# Patient Record
Sex: Male | Born: 1950 | Race: Black or African American | Hispanic: No | Marital: Single | Smoking: Current every day smoker
Health system: Southern US, Community
[De-identification: ages and names within clinical notes are randomized; demographics above are authoritative.]

## PROBLEM LIST (undated history)

## (undated) HISTORY — PX: FRACTURE SURGERY: SHX138

---

## 2020-07-02 ENCOUNTER — Observation Stay (HOSPITAL_COMMUNITY): Payer: Medicare (Managed Care)

## 2020-07-02 ENCOUNTER — Inpatient Hospital Stay (HOSPITAL_COMMUNITY)
Admission: EM | Admit: 2020-07-02 | Discharge: 2020-07-08 | DRG: 065 | Disposition: A | Discharge status: Skilled Nursing Facility | Payer: Medicare (Managed Care) | Attending: Internal Medicine | Admitting: Internal Medicine

## 2020-07-02 ENCOUNTER — Emergency Department (HOSPITAL_COMMUNITY): Payer: Medicare (Managed Care)

## 2020-07-02 ENCOUNTER — Other Ambulatory Visit: Payer: Self-pay

## 2020-07-02 ENCOUNTER — Encounter (HOSPITAL_COMMUNITY): Payer: Self-pay

## 2020-07-02 DIAGNOSIS — K5900 Constipation, unspecified: Secondary | ICD-10-CM | POA: Diagnosis present

## 2020-07-02 DIAGNOSIS — I6502 Occlusion and stenosis of left vertebral artery: Secondary | ICD-10-CM | POA: Diagnosis present

## 2020-07-02 DIAGNOSIS — R2990 Unspecified symptoms and signs involving the nervous system: Secondary | ICD-10-CM | POA: Diagnosis not present

## 2020-07-02 DIAGNOSIS — G8191 Hemiplegia, unspecified affecting right dominant side: Secondary | ICD-10-CM | POA: Diagnosis present

## 2020-07-02 DIAGNOSIS — R29706 NIHSS score 6: Secondary | ICD-10-CM | POA: Diagnosis not present

## 2020-07-02 DIAGNOSIS — I6389 Other cerebral infarction: Secondary | ICD-10-CM | POA: Diagnosis not present

## 2020-07-02 DIAGNOSIS — R29707 NIHSS score 7: Secondary | ICD-10-CM | POA: Diagnosis not present

## 2020-07-02 DIAGNOSIS — R29702 NIHSS score 2: Secondary | ICD-10-CM | POA: Diagnosis not present

## 2020-07-02 DIAGNOSIS — R29703 NIHSS score 3: Secondary | ICD-10-CM | POA: Diagnosis not present

## 2020-07-02 DIAGNOSIS — Z20822 Contact with and (suspected) exposure to covid-19: Secondary | ICD-10-CM | POA: Diagnosis present

## 2020-07-02 DIAGNOSIS — F17210 Nicotine dependence, cigarettes, uncomplicated: Secondary | ICD-10-CM | POA: Diagnosis present

## 2020-07-02 DIAGNOSIS — I10 Essential (primary) hypertension: Secondary | ICD-10-CM | POA: Diagnosis present

## 2020-07-02 DIAGNOSIS — R29705 NIHSS score 5: Secondary | ICD-10-CM | POA: Diagnosis present

## 2020-07-02 DIAGNOSIS — W19XXXA Unspecified fall, initial encounter: Secondary | ICD-10-CM

## 2020-07-02 DIAGNOSIS — E041 Nontoxic single thyroid nodule: Secondary | ICD-10-CM | POA: Diagnosis not present

## 2020-07-02 DIAGNOSIS — R4781 Slurred speech: Secondary | ICD-10-CM | POA: Diagnosis not present

## 2020-07-02 DIAGNOSIS — I444 Left anterior fascicular block: Secondary | ICD-10-CM | POA: Diagnosis present

## 2020-07-02 DIAGNOSIS — I6381 Other cerebral infarction due to occlusion or stenosis of small artery: Secondary | ICD-10-CM | POA: Diagnosis not present

## 2020-07-02 DIAGNOSIS — R29704 NIHSS score 4: Secondary | ICD-10-CM | POA: Diagnosis not present

## 2020-07-02 DIAGNOSIS — E785 Hyperlipidemia, unspecified: Secondary | ICD-10-CM | POA: Diagnosis present

## 2020-07-02 DIAGNOSIS — I639 Cerebral infarction, unspecified: Secondary | ICD-10-CM | POA: Diagnosis present

## 2020-07-02 LAB — ECHOCARDIOGRAM COMPLETE
Area-P 1/2: 3.65 cm2
Height: 72 in
S' Lateral: 3.9 cm
Weight: 3040 oz

## 2020-07-02 LAB — CBC
HCT: 45.4 % (ref 39.0–52.0)
Hemoglobin: 15.2 g/dL (ref 13.0–17.0)
MCH: 31.6 pg (ref 26.0–34.0)
MCHC: 33.5 g/dL (ref 30.0–36.0)
MCV: 94.4 fL (ref 80.0–100.0)
Platelets: 192 10*3/uL (ref 150–400)
RBC: 4.81 MIL/uL (ref 4.22–5.81)
RDW: 12.5 % (ref 11.5–15.5)
WBC: 4.4 10*3/uL (ref 4.0–10.5)
nRBC: 0 % (ref 0.0–0.2)

## 2020-07-02 LAB — I-STAT CHEM 8, ED
BUN: 17 mg/dL (ref 8–23)
Calcium, Ion: 1.21 mmol/L (ref 1.15–1.40)
Chloride: 104 mmol/L (ref 98–111)
Creatinine, Ser: 1 mg/dL (ref 0.61–1.24)
Glucose, Bld: 153 mg/dL — ABNORMAL HIGH (ref 70–99)
HCT: 43 % (ref 39.0–52.0)
Hemoglobin: 14.6 g/dL (ref 13.0–17.0)
Potassium: 4.1 mmol/L (ref 3.5–5.1)
Sodium: 139 mmol/L (ref 135–145)
TCO2: 24 mmol/L (ref 22–32)

## 2020-07-02 LAB — RAPID URINE DRUG SCREEN, HOSP PERFORMED
Amphetamines: NOT DETECTED
Barbiturates: NOT DETECTED
Benzodiazepines: NOT DETECTED
Cocaine: NOT DETECTED
Opiates: NOT DETECTED
Tetrahydrocannabinol: NOT DETECTED

## 2020-07-02 LAB — DIFFERENTIAL
Abs Immature Granulocytes: 0.01 10*3/uL (ref 0.00–0.07)
Basophils Absolute: 0 10*3/uL (ref 0.0–0.1)
Basophils Relative: 1 %
Eosinophils Absolute: 0.2 10*3/uL (ref 0.0–0.5)
Eosinophils Relative: 4 %
Immature Granulocytes: 0 %
Lymphocytes Relative: 26 %
Lymphs Abs: 1.2 10*3/uL (ref 0.7–4.0)
Monocytes Absolute: 0.7 10*3/uL (ref 0.1–1.0)
Monocytes Relative: 15 %
Neutro Abs: 2.4 10*3/uL (ref 1.7–7.7)
Neutrophils Relative %: 54 %

## 2020-07-02 LAB — URINALYSIS, ROUTINE W REFLEX MICROSCOPIC
Bacteria, UA: NONE SEEN
Bilirubin Urine: NEGATIVE
Glucose, UA: NEGATIVE mg/dL
Hgb urine dipstick: NEGATIVE
Ketones, ur: NEGATIVE mg/dL
Nitrite: NEGATIVE
Protein, ur: NEGATIVE mg/dL
Specific Gravity, Urine: 1.013 (ref 1.005–1.030)
pH: 6 (ref 5.0–8.0)

## 2020-07-02 LAB — BASIC METABOLIC PANEL
Anion gap: 9 (ref 5–15)
BUN: 16 mg/dL (ref 8–23)
CO2: 25 mmol/L (ref 22–32)
Calcium: 9.6 mg/dL (ref 8.9–10.3)
Chloride: 103 mmol/L (ref 98–111)
Creatinine, Ser: 1.02 mg/dL (ref 0.61–1.24)
GFR, Estimated: >60 mL/min (ref >60)
Glucose, Bld: 153 mg/dL — ABNORMAL HIGH (ref 70–99)
Potassium: 4.2 mmol/L (ref 3.5–5.1)
Sodium: 137 mmol/L (ref 135–145)

## 2020-07-02 LAB — PROTIME-INR
INR: 1 (ref 0.8–1.2)
Prothrombin Time: 12.8 seconds (ref 11.4–15.2)

## 2020-07-02 LAB — RESP PANEL BY RT-PCR (FLU A&B, COVID) ARPGX2
Influenza A by PCR: NEGATIVE
Influenza B by PCR: NEGATIVE
SARS Coronavirus 2 by RT PCR: NEGATIVE

## 2020-07-02 LAB — CBG MONITORING, ED
Glucose-Capillary: 150 mg/dL — ABNORMAL HIGH (ref 70–99)
Glucose-Capillary: 164 mg/dL — ABNORMAL HIGH (ref 70–99)

## 2020-07-02 LAB — APTT: aPTT: 27 seconds (ref 24–36)

## 2020-07-02 LAB — TSH: TSH: 0.419 u[IU]/mL (ref 0.350–4.500)

## 2020-07-02 MED ORDER — ATORVASTATIN CALCIUM 40 MG PO TABS
40.0000 mg | ORAL_TABLET | Freq: Every day | ORAL | Status: DC
  Administered 2020-07-02 – 2020-07-08 (×7): 40 mg via ORAL
  Filled 2020-07-02 (×7): qty 1

## 2020-07-02 MED ORDER — ENOXAPARIN SODIUM 40 MG/0.4ML ~~LOC~~ SOLN
40.0000 mg | SUBCUTANEOUS | Status: DC
  Administered 2020-07-02 – 2020-07-07 (×6): 40 mg via SUBCUTANEOUS
  Filled 2020-07-02 (×6): qty 0.4

## 2020-07-02 MED ORDER — ACETAMINOPHEN 650 MG RE SUPP: 650.0000 mg | RECTAL | Status: DC | PRN

## 2020-07-02 MED ORDER — ASPIRIN EC 81 MG PO TBEC
81.0000 mg | DELAYED_RELEASE_TABLET | Freq: Every day | ORAL | Status: DC
  Administered 2020-07-03 – 2020-07-05 (×3): 81 mg via ORAL
  Filled 2020-07-02 (×6): qty 1

## 2020-07-02 MED ORDER — SENNOSIDES-DOCUSATE SODIUM 8.6-50 MG PO TABS
1.0000 | ORAL_TABLET | Freq: Every evening | ORAL | Status: DC | PRN
  Administered 2020-07-03 – 2020-07-06 (×4): 1 via ORAL
  Filled 2020-07-02 (×4): qty 1

## 2020-07-02 MED ORDER — STROKE: EARLY STAGES OF RECOVERY BOOK
Freq: Once | Status: AC
  Filled 2020-07-02: qty 1

## 2020-07-02 MED ORDER — IOHEXOL 350 MG/ML SOLN
75.0000 mL | Freq: Once | INTRAVENOUS | Status: AC | PRN
  Administered 2020-07-02: 75 mL via INTRAVENOUS

## 2020-07-02 MED ORDER — ENSURE ENLIVE PO LIQD
237.0000 mL | Freq: Two times a day (BID) | ORAL | Status: DC
  Administered 2020-07-02 – 2020-07-07 (×9): 237 mL via ORAL

## 2020-07-02 MED ORDER — ACETAMINOPHEN 160 MG/5ML PO SOLN: 650.0000 mg | ORAL | Status: DC | PRN

## 2020-07-02 MED ORDER — ASPIRIN 325 MG PO TABS
325.0000 mg | ORAL_TABLET | Freq: Once | ORAL | Status: AC
  Administered 2020-07-02: 325 mg via ORAL
  Filled 2020-07-02: qty 1

## 2020-07-02 MED ORDER — SODIUM CHLORIDE 0.9 % IV SOLN: INTRAVENOUS | Status: DC

## 2020-07-02 MED ORDER — ACETAMINOPHEN 325 MG PO TABS
650.0000 mg | ORAL_TABLET | ORAL | Status: DC | PRN
  Filled 2020-07-02: qty 2

## 2020-07-02 MED ORDER — ADULT MULTIVITAMIN W/MINERALS CH
1.0000 | ORAL_TABLET | Freq: Every day | ORAL | Status: DC
  Administered 2020-07-03 – 2020-07-08 (×6): 1 via ORAL
  Filled 2020-07-02 (×6): qty 1

## 2020-07-02 MED ORDER — SODIUM CHLORIDE 0.9% FLUSH: 3.0000 mL | Freq: Once | INTRAVENOUS | Status: DC

## 2020-07-02 NOTE — ED Notes (Signed)
Patient states he travelled here from [place] by train and arrived today. He noticed his R leg was weak at about 6pm. Has been having intermittent R sided weakness while on the train, noticed that his speech sounded slurred to him tonight.

## 2020-07-02 NOTE — H&P (Signed)
History and Physical    Jeremy Knapp JYN:[id number] DOB: [birth year] DOA: 07/02/2020  PCP: System, Provider Not In - does not see regularly; other than trauma a year or two ago, he last saw PCP a few years ago Consultants:  None Patient coming from:  Visiting from [facility]; NOK: Niece, Jeremy Knapp, [phone]  Chief Complaint: Neurologic symptoms  HPI: Jeremy Knapp is a 70 y.o. male without known significant medical history presenting with slurred speech and R-sided weakness.  He started feeling dizzy and having trouble moving his R body.  He noticed this Thursday evening.  It wasn't that bad so he went to work Friday. He was mildly slow but could manage.  Last night, he got on the train and it got feel bad.  He was planning to go home Wednesday.  He visits here about every 3 months.  His arm is affected but mainly it is his R leg - has to drag it and forcefully lift it, pulling his body towards the R and it causes him to fall if he doesn't catch himself.  He noticed his speech is slurred and slower than usual.  No dysarthria.    ED Course:  Stroke-like symptoms, neuro consulted.  MRI pending.  Needs admission for further evaluation.  Review of Systems: As per HPI; otherwise review of systems reviewed and negative.   Ambulatory Status:  Ambulates without assistance  COVID Vaccine Status:  Complete  History reviewed. No pertinent past medical history.  Past Surgical History:  Procedure Laterality Date  . FRACTURE SURGERY      Social History   Socioeconomic History  . Marital status: Single    Spouse name: Not on file  . Number of children: Not on file  . Years of education: Not on file  . Highest education level: Not on file  Occupational History  . 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 Smoking status: Current Every Day Smoker    Packs/day: 0.25    Years: 30.00    Pack years: 7.50    Types: Cigarettes  . Smokeless tobacco: Never Used  Substance and  Sexual Activity  . Alcohol use: Never  . Drug use: Never  . Sexual activity: Not on file  Other Topics Concern  . Not on file  Social History Narrative  . Not on file   Social Determinants of Health   Financial Resource Strain: Not on file  Food Insecurity: Not on file  Transportation Needs: Not on file  Physical Activity: Not on file  Stress: Not on file  Social Connections: Not on file  Intimate Partner Violence: Not on file    No Known Allergies  Family History  Problem Relation Age of Onset  . Dementia Sister   . Stroke Neg Hx     Prior to Admission medications   Not on File    Physical Exam: Vitals:   07/02/20 0700 07/02/20 0730 07/02/20 0834 07/02/20 1122  BP: 140/86 138/80 137/77 (!) 112/99  Pulse: 82 74 71 87  Resp: Temp:   97.9 F (36.6 C) 97.9 F (36.6 C)  TempSrc:   Oral Oral  SpO2: 99% 97% 98% 96%  Weight:      Height:         . General:  Appears calm and comfortable and is in NAD . Eyes:   EOMI, normal lids, iris . ENT:  grossly normal hearing, lips & tongue, mmm; appropriate dentition . Neck:  no  LAD, masses or thyromegaly . Cardiovascular:  RRR, no m/r/g. No LE edema.  Marland Kitchen Respiratory:   CTA bilaterally with no wheezes/rales/rhonchi.  Normal respiratory effort. . Abdomen:  soft, NT, ND . Back:   normal alignment, no CVAT . Skin:  no rash or induration seen on limited exam . Musculoskeletal:  R-sided weakness 4/5 of both upper and lower extremities . Psychiatric:  grossly normal mood and affect, speech fluent and appropriate, AOx3 . Neurologic:  CN 2-12 grossly intact, moves all extremities in coordinated fashion, sensation increased on L forehead (V1) and decreased on R body    Radiological Exams on Admission: Independently reviewed - see discussion in A/P where applicable  CT ANGIO HEAD W OR WO CONTRAST  Result Date: 07/02/2020 CLINICAL DATA:  Stroke. Acute left corona radiata and caudate infarct on MRI. EXAM: CT  ANGIOGRAPHY HEAD AND NECK TECHNIQUE: Multidetector CT imaging of the head and neck was performed using the standard protocol during bolus administration of intravenous contrast. Multiplanar CT image reconstructions and MIPs were obtained to evaluate the vascular anatomy. Carotid stenosis measurements (when applicable) are obtained utilizing NASCET criteria, using the distal internal carotid diameter as the denominator. CONTRAST:  75mL OMNIPAQUE IOHEXOL 350 MG/ML SOLN COMPARISON:  None. FINDINGS: CTA NECK FINDINGS Aortic arch: Standard 3 vessel aortic arch with widely patent arch vessel origins. Right carotid system: Patent without evidence of stenosis, dissection, or significant atherosclerosis. Left carotid system: Patent without evidence of stenosis, dissection, or significant atherosclerosis. Vertebral arteries: The left vertebral artery is patent and strongly dominant without evidence of a significant stenosis or dissection. The right vertebral artery is extremely hypoplastic and is intermittently poorly visualized throughout its course in the neck with continuous patency not able to be confirmed. The left V3 segment is likely occluded. Skeleton: No acute osseous abnormality or suspicious osseous lesion. Other neck: 3.1 cm left thyroid nodule. No evidence of cervical lymphadenopathy. Upper chest: Clear lung apices. Review of the MIP images confirms the above findings CTA HEAD FINDINGS Anterior circulation: The internal carotid arteries are patent from skull base to carotid termini with atherosclerotic plaque resulting in mild right proximal supraclinoid stenosis. ACAs and MCAs are patent without evidence of a proximal branch occlusion or flow limiting proximal stenosis. There is mild left M1 narrowing. No aneurysm is identified. Posterior circulation: The intracranial left vertebral artery is patent with atherosclerotic irregularity and up to mild narrowing. The right V4 segment is not clearly identified and is  likely occluded. Patent left PICA, bilateral AICA, and bilateral SCA origins are visualized. The basilar artery is patent with mild irregularity but no flow limiting stenosis. Posterior communicating arteries are not identified and may be diminutive or absent. The PCAs are patent with atherosclerotic type irregularity bilaterally as well as mild-to-moderate left P2 stenosis. No aneurysm is identified. Venous sinuses: Patent. Anatomic variants: Strongly dominant left vertebral artery. Review of the MIP images confirms the above findings IMPRESSION: 1. 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 2. Patent left vertebral artery with mild V4 segment stenosis. 3. Mild right supraclinoid ICA and left M1 stenoses. 4. Widely patent cervical carotid arteries. 5. 3.1 cm left thyroid nodule. Recommend thyroid US (ref: J Am Coll Radiol. 2015 Feb;12(2): 143-50). Electronically Signed   By: Sebastian Ache M.D.   On: 07/02/2020 10:23   CT Head Wo Contrast  Result Date: 07/02/2020 CLINICAL DATA:  Facial trauma, weakness EXAM: CT HEAD WITHOUT CONTRAST TECHNIQUE: Contiguous axial images were obtained from the base of the skull through  the vertex without intravenous contrast. COMPARISON:  None. FINDINGS: Brain: No evidence of large-territorial acute infarction. No parenchymal hemorrhage. No mass lesion. No extra-axial collection. No mass effect or midline shift. No hydrocephalus. Basilar cisterns are patent. Vascular: No hyperdense vessel. Atherosclerotic calcifications are present within the cavernous internal carotid arteries. Skull: No acute fracture or focal lesion. Sinuses/Orbits: Paranasal sinuses and mastoid air cells are clear. The orbits are unremarkable. Other: None. IMPRESSION: No acute intracranial abnormality. Electronically Signed   By: Tish Frederickson M.D.   On: 07/02/2020 04:35   CT ANGIO NECK W OR WO CONTRAST  Result Date: 07/02/2020 CLINICAL DATA:  Stroke. Acute left corona radiata and caudate  infarct on MRI. EXAM: CT ANGIOGRAPHY HEAD AND NECK TECHNIQUE: Multidetector CT imaging of the head and neck was performed using the standard protocol during bolus administration of intravenous contrast. Multiplanar CT image reconstructions and MIPs were obtained to evaluate the vascular anatomy. Carotid stenosis measurements (when applicable) are obtained utilizing NASCET criteria, using the distal internal carotid diameter as the denominator. CONTRAST:  73mL OMNIPAQUE IOHEXOL 350 MG/ML SOLN COMPARISON:  None. FINDINGS: CTA NECK FINDINGS Aortic arch: Standard 3 vessel aortic arch with widely patent arch vessel origins. Right carotid system: Patent without evidence of stenosis, dissection, or significant atherosclerosis. Left carotid system: Patent without evidence of stenosis, dissection, or significant atherosclerosis. Vertebral arteries: The left vertebral artery is patent and strongly dominant without evidence of a significant stenosis or dissection. The right vertebral artery is extremely hypoplastic and is intermittently poorly visualized throughout its course in the neck with continuous patency not able to be confirmed. The left V3 segment is likely occluded. Skeleton: No acute osseous abnormality or suspicious osseous lesion. Other neck: 3.1 cm left thyroid nodule. No evidence of cervical lymphadenopathy. Upper chest: Clear lung apices. Review of the MIP images confirms the above findings CTA HEAD FINDINGS Anterior circulation: The internal carotid arteries are patent from skull base to carotid termini with atherosclerotic plaque resulting in mild right proximal supraclinoid stenosis. ACAs and MCAs are patent without evidence of a proximal branch occlusion or flow limiting proximal stenosis. There is mild left M1 narrowing. No aneurysm is identified. Posterior circulation: The intracranial left vertebral artery is patent with atherosclerotic irregularity and up to mild narrowing. The right V4 segment is not  clearly identified and is likely occluded. Patent left PICA, bilateral AICA, and bilateral SCA origins are visualized. The basilar artery is patent with mild irregularity but no flow limiting stenosis. Posterior communicating arteries are not identified and may be diminutive or absent. The PCAs are patent with atherosclerotic type irregularity bilaterally as well as mild-to-moderate left P2 stenosis. No aneurysm is identified. Venous sinuses: Patent. Anatomic variants: Strongly dominant left vertebral artery. Review of the MIP images confirms the above findings IMPRESSION: 1. 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 2. Patent left vertebral artery with mild V4 segment stenosis. 3. Mild right supraclinoid ICA and left M1 stenoses. 4. Widely patent cervical carotid arteries. 5. 3.1 cm left thyroid nodule. Recommend thyroid US (ref: J Am Coll Radiol. 2015 Feb;12(2): 143-50). Electronically Signed   By: Sebastian Ache M.D.   On: 07/02/2020 10:23   MR BRAIN WO CONTRAST  Result Date: 07/02/2020 CLINICAL DATA:  Sudden onset weakness since yesterday. EXAM: MRI HEAD WITHOUT CONTRAST TECHNIQUE: Multiplanar, multiecho pulse sequences of the brain and surrounding structures were obtained without intravenous contrast. COMPARISON:  Head CT from earlier today FINDINGS: Brain: Wedge of restricted diffusion at the left corona radiata and  caudate body. Minor underlying chronic white matter changes. No pre-existing gray matter infarct, acute hemorrhage, hydrocephalus, mass, or collection. Focus of chronic hemorrhage or mineralization at the right occipital pole. Vascular: Normal flow voids Skull and upper cervical spine: Normal marrow signal Sinuses/Orbits: Opacified left ethmoid sinus.  No acute finding. 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 Electronically Signed   By: Marnee SpringJonathon  Watts M.D.   On: 07/02/2020 08:21   ECHOCARDIOGRAM COMPLETE  Result Date: 07/02/2020     ECHOCARDIOGRAM REPORT   Patient Name:   Jeremy Knapp Date of Exam: 07/02/2020 Medical Rec #:  [id number]     Height:       72.0 in Accession #:    [id number]    Weight:       190.0 lb Date of Birth:  [birth year]     BSA:          2.085 m Patient Age:    69 years      BP:           137/77 mmHg Patient Gender: M             HR:           70 bpm. Exam Location:  Inpatient Procedure: 2D Echo Indications:    TIA  History:        Patient has no prior history of Echocardiogram examinations.  Sonographer:    Delcie RochLauren Pennington Referring Phys: [id number] Allie Ousley IMPRESSIONS  1. Left ventricular ejection fraction, by estimation, is 50 to 55%. The left ventricle has low normal function. The left ventricle has no regional wall motion abnormalities. Left ventricular diastolic parameters are consistent with Grade I diastolic dysfunction (impaired relaxation). Global longitudinal strain performed but not reported based on interpreter judgement due to suboptimal tracking.  2. Right ventricular systolic function is normal. The right ventricular size is normal. There is normal pulmonary artery systolic pressure. The estimated right ventricular systolic pressure is 20.0 mmHg.  3. The mitral valve is normal in structure. Trivial mitral valve regurgitation. No evidence of mitral stenosis.  4. The aortic valve is tricuspid. Aortic valve regurgitation is not visualized. Mild aortic valve sclerosis is present, with no evidence of aortic valve stenosis.  5. The inferior vena cava is normal in size with greater than 50% respiratory variability, suggesting right atrial pressure of 3 mmHg. FINDINGS  Left Ventricle: Left ventricular ejection fraction, by estimation, is 50 to 55%. The left ventricle has low normal function. The left ventricle has no regional wall motion abnormalities. Global longitudinal strain performed but not reported based on interpreter judgement due to suboptimal tracking. The left ventricular internal cavity size was  normal in size. There is no left ventricular hypertrophy. Left ventricular diastolic parameters are consistent with Grade I diastolic dysfunction (impaired relaxation). Normal left ventricular filling pressure. Right Ventricle: The right ventricular size is normal. No increase in right ventricular wall thickness. Right ventricular systolic function is normal. There is normal pulmonary artery systolic pressure. The tricuspid regurgitant velocity is 2.06 m/s, and  with an assumed right atrial pressure of 3 mmHg, the estimated right ventricular systolic pressure is 20.0 mmHg. Left Atrium: Left atrial size was normal in size. Right Atrium: Right atrial size was normal in size. Pericardium: There is no evidence of pericardial effusion. Mitral Valve: The mitral valve is normal in structure. There is mild calcification of the mitral valve leaflet(s). Trivial mitral valve regurgitation. No evidence of mitral valve stenosis. Tricuspid Valve: The  tricuspid valve is normal in structure. Tricuspid valve regurgitation is trivial. No evidence of tricuspid stenosis. Aortic Valve: The aortic valve is tricuspid. Aortic valve regurgitation is not visualized. Mild aortic valve sclerosis is present, with no evidence of aortic valve stenosis. Pulmonic Valve: The pulmonic valve was normal in structure. Pulmonic valve regurgitation is trivial. No evidence of pulmonic stenosis. Aorta: The aortic root is normal in size and structure. Venous: The inferior vena cava is normal in size with greater than 50% respiratory variability, suggesting right atrial pressure of 3 mmHg. IAS/Shunts: No atrial level shunt detected by color flow Doppler.  LEFT VENTRICLE PLAX 2D LVIDd:         5.50 cm  Diastology LVIDs:         3.90 cm  LV e' medial:    7.07 cm/s LV PW:         1.00 cm  LV E/e' medial:  10.3 LV IVS:        0.80 cm  LV e' lateral:   9.14 cm/s LVOT diam:     2.10 cm  LV E/e' lateral: 8.0 LV SV:         58 LV SV Index:   28 LVOT Area:     3.46  cm  RIGHT VENTRICLE             IVC RV S prime:     11.30 cm/s  IVC diam: 2.30 cm TAPSE (M-mode): 2.6 cm LEFT ATRIUM             Index       RIGHT ATRIUM           Index LA diam:        4.30 cm 2.06 cm/m  RA Area:     12.70 cm LA Vol (A2C):   60.0 ml 28.78 ml/m RA Volume:   29.60 ml  14.20 ml/m LA Vol (A4C):   51.8 ml 24.85 ml/m LA Biplane Vol: 56.5 ml 27.10 ml/m  AORTIC VALVE LVOT Vmax:   77.70 cm/s LVOT Vmean:  51.400 cm/s LVOT VTI:    0.168 m  AORTA Ao Root diam: 3.60 cm Ao Asc diam:  3.60 cm MITRAL VALVE                TRICUSPID VALVE MV Area (PHT): 3.65 cm     TR Peak grad:   17.0 mmHg MV Decel Time: 208 msec     TR Vmax:        206.00 cm/s MV E velocity: 72.80 cm/s MV A velocity: 106.00 cm/s  SHUNTS MV E/A ratio:  0.69         Systemic VTI:  0.17 m                             Systemic Diam: 2.10 cm Jeremy Magic MD Electronically signed by Jeremy Magic MD Signature Date/Time: 07/02/2020/11:10:29 AM    Final     EKG: Independently reviewed.  NSR with rate NSR; nonspecific ST changes with no evidence of acute ischemia   Labs on Admission: I have personally reviewed the available labs and imaging studies at the time of the admission.  Pertinent labs:   Glucose 153 Unremarkable CBC INR 1.0 COVID/flu negative   Assessment/Plan Active Problems:   Acute CVA (cerebrovascular accident) (HCC)   Thyroid nodule greater than or equal to 1.5 cm in diameter incidentally noted on imaging study   Acute CVA -Concerning for  TIA/CVA -ABCD2 score is 6 -tPA not indicated due to duration of symptoms -Aspirin has been given to reduce stroke mortality and decrease morbidity -Will place in observation status for CVA evaluation -Telemetry monitoring -MRI showed acute perforator infarct at the L corona radiata and caudate body -CTA with scattered stenosis including "diminutive" R vertebral artery with distal occlusion -Echo -If the patient does not have known afib and this is not detected on telemetry  during hospitalization, consider outpatient Holter monitoring and/or loop recorder placement. -Risk stratification with FLP, A1c; will also check TSH and UDS -Patient will need DAPT for 21 days when ABCD2 score is at least 4 and NIH score is 3 or less, and then can transition to monotherapy with a single antiplatelet agent.  Will defer to neurology for now. -Neurology consult -PT/OT/ST/Nutrition Consults -CIR consult for placement -Empirically start Lipitor 40 mg daily -Allow permissive HTN for now; treat BP only if >220/120, and then with goal of 15% reduction -He is not on any home medications and denies knowledge of any chronic medical problems  Thyroid nodule -Needs [place], can likely be performed as an outpatient - although since he is likely to be here it could also be done while hospitalized   Note: He lives in [place] and is visiting [place] and this may play a role in his D/C planning.    Note: This patient has been tested and is negative for the novel coronavirus COVID-19. He has been fully vaccinated against COVID-19.    DVT prophylaxis:  Lovenox  Code Status: Full - confirmed with patient Family Communication: None present Disposition Plan:  The patient is from: home  Anticipated d/c is to: CIR  Anticipated d/c date will depend on clinical response to treatment, likely 2-3 days  Patient is currently: acutely ill Consults called: Neurology; PT/OT/ST/Nutrition; [facility] team Admission status: It is my clinical opinion that referral for OBSERVATION is reasonable and necessary in this patient based on the above information provided. The aforementioned taken together are felt to place the patient at high risk for further clinical deterioration. However it is anticipated that the patient may be medically stable for discharge from the hospital within 24 to 48 hours.      Jonah Blue MD [facility]   How to contact the [facility] Attending or Consulting provider 7A - 7P or covering  provider during after hours 7P -7A, for this patient?  1. Check the care team in [facility] and look for a) attending/consulting [facility] provider listed and b) the [facility] team listed 2. Log into [email or web address] and use [place]'s universal password to access. If you do not have the password, please contact the hospital operator. 3. Locate the [facility] provider you are looking for under [facility] and page to a number that you can be directly reached. 4. If you still have difficulty reaching the provider, please page the [facility] (Director on Call) for the Hospitalists listed on amion for assistance.   07/02/2020, 12:52 PM

## 2020-07-02 NOTE — Consult Note (Signed)
Physical Medicine and Rehabilitation Consult Reason for Consult: Right side weakness and slurred speech Referring Physician: Dr. Jonah BlueJennifer Knapp   HPI: Jeremy MedicoMichael Knapp is a 70 y.o. right-handed male with history of tobacco use on no prescription medications.  Presented 07/02/2020 with acute onset of right-sided weakness and slurred speech as well as dizziness with fall x2.  Per chart review patient is from [place].  He was in the [place] area visiting his brother and sister.  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  CT angiogram head and neck diminutive right vertebral artery which it was occluded distally.  Patent left vertebral artery with mild V4 segment stenosis.  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  Patient did not receive tPA.  Echocardiogram with ejection fraction of 50 to 55% grade 1 diastolic dysfunction.  Admission chemistries unremarkable except glucose 153, urine drug screen negative.  Currently maintained on aspirin as well as Plavix for CVA prophylaxis.  Subcutaneous Lovenox for DVT prophylaxis.  Tolerating a regular diet.  Therapy evaluations completed due to patient's right side weakness and slurred speech recommendations of physical medicine rehab consult.  Pt states that RUE weaker than yesterday, no change in RLE strength , speech or swallowing since yesterday   Review of Systems  Constitutional: Negative for chills and fever.  HENT: Negative for hearing loss.   Eyes: Negative for blurred vision and double vision.  Respiratory: Negative for cough and shortness of breath.   Cardiovascular: Negative for chest pain.  Gastrointestinal: Positive for constipation. Negative for heartburn, nausea and vomiting.  Genitourinary: Negative for dysuria, flank pain and hematuria.  Musculoskeletal: Positive for falls.  Skin: Negative for rash.  Neurological: Positive for dizziness, speech  change and weakness.  All other systems reviewed and are negative.  History reviewed. No pertinent past medical history. Past Surgical History:  Procedure Laterality Date  . FRACTURE SURGERY     Family History  Problem Relation Age of Onset  . Dementia Sister   . Stroke Neg Hx    Social History:  reports that he has been smoking cigarettes. He has a 7.50 pack-year smoking history. He has never used smokeless tobacco. He reports that he does not drink alcohol and does not use drugs. Allergies: No Known Allergies No medications prior to admission.    Home: Home Living Family/patient expects to be discharged to:: Private residence Living Arrangements: Alone (From [place], sister and brother lives here in [place]) Available Help at Discharge: Family,Available PRN/intermittently Type of Home: Apartment Home Access: Stairs to enter Entergy CorporationEntrance Stairs-Number of Steps: 10 Entrance Stairs-Rails: Left Home Layout: One level (apartment is in the basement) Bathroom Shower/Tub: Engineer, manufacturing systemsTub/shower unit Bathroom Toilet: Standard Bathroom Accessibility: Yes Home Equipment: None Additional Comments: Pt lives and works in [place], [place], but was visiting sister via 10 hour train ride.  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 Cues for hand placement Ambulation/Gait Ambulation/Gait assistance: Min assist,Mod assist Gait Distance (  Feet): 15  Feet Assistive device: Rolling walker (2 wheeled) Gait Pattern/deviations: Step-to pattern,Decreased stride length,Decreased stance time - right,Decreased step length - left,Decreased weight shift to left,Trunk flexed,Wide base of support General Gait Details: Tends to drag R foot behind due to weakness. Cues for increasing step length. MinA for straight path but modA for turns and close quarters. Gait velocity: decreased    ADL: ADL Overall ADL's : Needs assistance/impaired Eating/Feeding: Set up,Sitting Eating/Feeding Details (indicate cue type and reason): pt given built up handle for self feeding for RUE; assist required to bring fork to mouth due to weakness and poor coordination Grooming: Minimal assistance,Standing Grooming Details (indicate cue type and reason): leaning on counter, increased time to open.close containers Upper Body Bathing: Minimal assistance,Sitting Lower Body Bathing: Moderate assistance,Sitting/lateral leans,Sit to/from stand Upper Body Dressing : Minimal assistance,Sitting,Standing Lower Body Dressing: Moderate assistance,Cueing for safety Toilet Transfer: Minimal assistance,+2 for physical assistance,+2 for safety/equipment,Ambulation,RW Toilet Transfer Details (indicate cue type and reason): pt requiring cues to avoid obstacles and to keep RLE from dragging Toileting- Clothing Manipulation and Hygiene: Moderate assistance,Sitting/lateral lean,Sit to/from stand Toileting - Clothing Manipulation Details (indicate cue type and reason): pt sat on commode, but unable to haev BM- pt wanting laxative. Functional mobility during ADLs: Minimal assistance,Rolling walker,Cueing for safety,Cueing for sequencing General ADL Comments: Pt limited by decreased strength, coordination and decreased ability to care for self with RUE weakness. Pt dragging RLE at times and cues to stay in RW for mobility in room.  Cognition: Cognition Overall Cognitive Status: Impaired/Different  from baseline Arousal/Alertness: Awake/alert Orientation Level: Oriented X4 Attention: Selective Selective Attention: Appears intact Memory: Impaired Memory Impairment: Other (comment),Retrieval deficit (recalled 3/5 words after 2 minute delay, recalled 3/4 delayed recall questions based on short story) Awareness: Appears intact Problem Solving: Appears intact Safety/Judgment: Appears intact Cognition Arousal/Alertness: Awake/alert Behavior During Therapy: Flat affect Overall Cognitive Status: Impaired/Different from baseline General Comments: Pt slightly frustrated with diagnosis wanting to know why and how he can prevent another CVA.  Blood pressure 137/73, pulse 72, temperature 98 F (36.7 C), temperature source Oral, resp. rate 18, height 6' (1.829 m), weight 86.2 kg, SpO2 98 %. Physical Exam Vitals and nursing note reviewed.  Constitutional:      Appearance: He is normal weight.  HENT:     Head: Atraumatic.  Eyes:     General:        Right eye: No discharge.        Left eye: No discharge.     Extraocular Movements: Extraocular movements intact.     Pupils: Pupils are equal, round, and reactive to light.  Cardiovascular:     Rate and Rhythm: Normal rate and regular rhythm.     Heart sounds: Normal heart sounds.  Pulmonary:     Effort: Pulmonary effort is normal. No respiratory distress.     Breath sounds: Normal breath sounds. No stridor.  Abdominal:     General: Abdomen is flat. Bowel sounds are normal. There is no distension.     Palpations: Abdomen is soft.  Musculoskeletal:     Comments: No pain with UE or LE ROM No joint swelling in limbs  Skin:    General: Skin is warm and dry.  Neurological:     Mental Status: He is alert and oriented to person, place, and time.     Cranial Nerves: No dysarthria.     Coordination: Coordination abnormal. Finger-Nose-Finger Test abnormal.     Comments: Patient is alert in no acute distress.  Mild dysarthria.  Oriented x3.   Makes good eye contact with examiner.  Follows commands.  Motor- 5/5 in LUE and LLE  3/5 Right delt, Bi, tri, grip, wrist flex and ext 4/5 Right HF, KE, ADF  Sensation itact to LT,  No facial droop Speech without aphasia  Psychiatric:        Mood and Affect: Mood normal.        Behavior: Behavior normal.     No results found for this or any previous visit (from the past 24 hour(s)). CT ANGIO HEAD W OR WO CONTRAST  Result Date: 07/02/2020 CLINICAL DATA:  Stroke. Acute left corona radiata and caudate infarct on MRI. EXAM: CT ANGIOGRAPHY HEAD AND NECK TECHNIQUE: Multidetector CT imaging of the head and neck was performed using the standard protocol during bolus administration of intravenous contrast. Multiplanar CT image reconstructions and MIPs were obtained to evaluate the vascular anatomy. Carotid stenosis measurements (when applicable) are obtained utilizing NASCET criteria, using the distal internal carotid diameter as the denominator. CONTRAST:  19mL OMNIPAQUE IOHEXOL 350 MG/ML SOLN COMPARISON:  None. FINDINGS: CTA NECK FINDINGS Aortic arch: Standard 3 vessel aortic arch with widely patent arch vessel origins. Right carotid system: Patent without evidence of stenosis, dissection, or significant atherosclerosis. Left carotid system: Patent without evidence of stenosis, dissection, or significant atherosclerosis. Vertebral arteries: The left vertebral artery is patent and strongly dominant without evidence of a significant stenosis or dissection. The right vertebral artery is extremely hypoplastic and is intermittently poorly visualized throughout its course in the neck with continuous patency not able to be confirmed. The left V3 segment is likely occluded. Skeleton: No acute osseous abnormality or suspicious osseous lesion. Other neck: 3.1 cm left thyroid nodule. No evidence of cervical lymphadenopathy. Upper chest: Clear lung apices. Review of the MIP images confirms the above findings CTA  HEAD FINDINGS Anterior circulation: The internal carotid arteries are patent from skull base to carotid termini with atherosclerotic plaque resulting in mild right proximal supraclinoid stenosis. ACAs and MCAs are patent without evidence of a proximal branch occlusion or flow limiting proximal stenosis. There is mild left M1 narrowing. No aneurysm is identified. Posterior circulation: The intracranial left vertebral artery is patent with atherosclerotic irregularity and up to mild narrowing. The right V4 segment is not clearly identified and is likely occluded. Patent left PICA, bilateral AICA, and bilateral SCA origins are visualized. The basilar artery is patent with mild irregularity but no flow limiting stenosis. Posterior communicating arteries are not identified and may be diminutive or absent. The PCAs are patent with atherosclerotic type irregularity bilaterally as well as mild-to-moderate left P2 stenosis. No aneurysm is identified. Venous sinuses: Patent. Anatomic variants: Strongly dominant left vertebral artery. Review of the MIP images confirms the above findings IMPRESSION: 1. 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 2. Patent left vertebral artery with mild V4 segment stenosis. 3. Mild right supraclinoid ICA and left M1 stenoses. 4. Widely patent cervical carotid arteries. 5. 3.1 cm left thyroid nodule. Recommend thyroid US (ref: J Am Coll Radiol. 2015 Feb;12(2): 143-50). Electronically Signed   By: Sebastian Ache M.D.   On: 07/02/2020 10:23   CT ANGIO NECK W OR WO CONTRAST  Result Date: 07/02/2020 CLINICAL DATA:  Stroke. Acute left corona radiata and caudate infarct on MRI. EXAM: CT ANGIOGRAPHY HEAD AND NECK TECHNIQUE: Multidetector CT imaging of the head and neck was performed using the standard protocol during bolus administration of intravenous contrast. Multiplanar CT image reconstructions and MIPs were  obtained to evaluate the vascular anatomy. Carotid stenosis  measurements (when applicable) are obtained utilizing NASCET criteria, using the distal internal carotid diameter as the denominator. CONTRAST:  47mL OMNIPAQUE IOHEXOL 350 MG/ML SOLN COMPARISON:  None. FINDINGS: CTA NECK FINDINGS Aortic arch: Standard 3 vessel aortic arch with widely patent arch vessel origins. Right carotid system: Patent without evidence of stenosis, dissection, or significant atherosclerosis. Left carotid system: Patent without evidence of stenosis, dissection, or significant atherosclerosis. Vertebral arteries: The left vertebral artery is patent and strongly dominant without evidence of a significant stenosis or dissection. The right vertebral artery is extremely hypoplastic and is intermittently poorly visualized throughout its course in the neck with continuous patency not able to be confirmed. The left V3 segment is likely occluded. Skeleton: No acute osseous abnormality or suspicious osseous lesion. Other neck: 3.1 cm left thyroid nodule. No evidence of cervical lymphadenopathy. Upper chest: Clear lung apices. Review of the MIP images confirms the above findings CTA HEAD FINDINGS Anterior circulation: The internal carotid arteries are patent from skull base to carotid termini with atherosclerotic plaque resulting in mild right proximal supraclinoid stenosis. ACAs and MCAs are patent without evidence of a proximal branch occlusion or flow limiting proximal stenosis. There is mild left M1 narrowing. No aneurysm is identified. Posterior circulation: The intracranial left vertebral artery is patent with atherosclerotic irregularity and up to mild narrowing. The right V4 segment is not clearly identified and is likely occluded. Patent left PICA, bilateral AICA, and bilateral SCA origins are visualized. The basilar artery is patent with mild irregularity but no flow limiting stenosis. Posterior communicating arteries are not identified and may be diminutive or absent. The PCAs are patent with  atherosclerotic type irregularity bilaterally as well as mild-to-moderate left P2 stenosis. No aneurysm is identified. Venous sinuses: Patent. Anatomic variants: Strongly dominant left vertebral artery. Review of the MIP images confirms the above findings IMPRESSION: 1. 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 2. Patent left vertebral artery with mild V4 segment stenosis. 3. Mild right supraclinoid ICA and left M1 stenoses. 4. Widely patent cervical carotid arteries. 5. 3.1 cm left thyroid nodule. Recommend thyroid US (ref: J Am Coll Radiol. 2015 Feb;12(2): 143-50). Electronically Signed   By: Sebastian Ache M.D.   On: 07/02/2020 10:23   MR BRAIN WO CONTRAST  Result Date: 07/02/2020 CLINICAL DATA:  Sudden onset weakness since yesterday. EXAM: MRI HEAD WITHOUT CONTRAST TECHNIQUE: Multiplanar, multiecho pulse sequences of the brain and surrounding structures were obtained without intravenous contrast. COMPARISON:  Head CT from earlier today FINDINGS: Brain: Wedge of restricted diffusion at the left corona radiata and caudate body. Minor underlying chronic white matter changes. No pre-existing gray matter infarct, acute hemorrhage, hydrocephalus, mass, or collection. Focus of chronic hemorrhage or mineralization at the right occipital pole. Vascular: Normal flow voids Skull and upper cervical spine: Normal marrow signal Sinuses/Orbits: Opacified left ethmoid sinus.  No acute finding. 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 Electronically Signed   By: Marnee Spring M.D.   On: 07/02/2020 08:21   ECHOCARDIOGRAM COMPLETE  Result Date: 07/02/2020    ECHOCARDIOGRAM REPORT   Patient Name:   Jeremy Knapp Date of Exam: 07/02/2020 Medical Rec #:  [id number]     Height:       72.0 in Accession #:    [id number]    Weight:       190.0 lb Date of Birth:  [birth year]     BSA:  2.085 m Patient Age:    69 years      BP:           137/77 mmHg Patient Gender: M              HR:           70 bpm. Exam Location:  Inpatient Procedure: 2D Echo Indications:    TIA  History:        Patient has no prior history of Echocardiogram examinations.  Sonographer:    Delcie Roch Referring Phys: [id number] Jeremy Knapp IMPRESSIONS  1. Left ventricular ejection fraction, by estimation, is 50 to 55%. The left ventricle has low normal function. The left ventricle has no regional wall motion abnormalities. Left ventricular diastolic parameters are consistent with Grade I diastolic dysfunction (impaired relaxation). Global longitudinal strain performed but not reported based on interpreter judgement due to suboptimal tracking.  2. Right ventricular systolic function is normal. The right ventricular size is normal. There is normal pulmonary artery systolic pressure. The estimated right ventricular systolic pressure is 20.0 mmHg.  3. The mitral valve is normal in structure. Trivial mitral valve regurgitation. No evidence of mitral stenosis.  4. The aortic valve is tricuspid. Aortic valve regurgitation is not visualized. Mild aortic valve sclerosis is present, with no evidence of aortic valve stenosis.  5. The inferior vena cava is normal in size with greater than 50% respiratory variability, suggesting right atrial pressure of 3 mmHg. FINDINGS  Left Ventricle: Left ventricular ejection fraction, by estimation, is 50 to 55%. The left ventricle has low normal function. The left ventricle has no regional wall motion abnormalities. Global longitudinal strain performed but not reported based on interpreter judgement due to suboptimal tracking. The left ventricular internal cavity size was normal in size. There is no left ventricular hypertrophy. Left ventricular diastolic parameters are consistent with Grade I diastolic dysfunction (impaired relaxation). Normal left ventricular filling pressure. Right Ventricle: The right ventricular size is normal. No increase in right ventricular wall thickness.  Right ventricular systolic function is normal. There is normal pulmonary artery systolic pressure. The tricuspid regurgitant velocity is 2.06 m/s, and  with an assumed right atrial pressure of 3 mmHg, the estimated right ventricular systolic pressure is 20.0 mmHg. Left Atrium: Left atrial size was normal in size. Right Atrium: Right atrial size was normal in size. Pericardium: There is no evidence of pericardial effusion. Mitral Valve: The mitral valve is normal in structure. There is mild calcification of the mitral valve leaflet(s). Trivial mitral valve regurgitation. No evidence of mitral valve stenosis. Tricuspid Valve: The tricuspid valve is normal in structure. Tricuspid valve regurgitation is trivial. No evidence of tricuspid stenosis. Aortic Valve: The aortic valve is tricuspid. Aortic valve regurgitation is not visualized. Mild aortic valve sclerosis is present, with no evidence of aortic valve stenosis. Pulmonic Valve: The pulmonic valve was normal in structure. Pulmonic valve regurgitation is trivial. No evidence of pulmonic stenosis. Aorta: The aortic root is normal in size and structure. Venous: The inferior vena cava is normal in size with greater than 50% respiratory variability, suggesting right atrial pressure of 3 mmHg. IAS/Shunts: No atrial level shunt detected by color flow Doppler.  LEFT VENTRICLE PLAX 2D LVIDd:         5.50 cm  Diastology LVIDs:         3.90 cm  LV e' medial:    7.07 cm/s LV PW:         1.00 cm  LV E/e' medial:  10.3 LV IVS:        0.80 cm  LV e' lateral:   9.14 cm/s LVOT diam:     2.10 cm  LV E/e' lateral: 8.0 LV SV:         58 LV SV Index:   28 LVOT Area:     3.46 cm  RIGHT VENTRICLE             IVC RV S prime:     11.30 cm/s  IVC diam: 2.30 cm TAPSE (M-mode): 2.6 cm LEFT ATRIUM             Index       RIGHT ATRIUM           Index LA diam:        4.30 cm 2.06 cm/m  RA Area:     12.70 cm LA Vol (A2C):   60.0 ml 28.78 ml/m RA Volume:   29.60 ml  14.20 ml/m LA Vol (A4C):    51.8 ml 24.85 ml/m LA Biplane Vol: 56.5 ml 27.10 ml/m  AORTIC VALVE LVOT Vmax:   77.70 cm/s LVOT Vmean:  51.400 cm/s LVOT VTI:    0.168 m  AORTA Ao Root diam: 3.60 cm Ao Asc diam:  3.60 cm MITRAL VALVE                TRICUSPID VALVE MV Area (PHT): 3.65 cm     TR Peak grad:   17.0 mmHg MV Decel Time: 208 msec     TR Vmax:        206.00 cm/s MV E velocity: 72.80 cm/s MV A velocity: 106.00 cm/s  SHUNTS MV E/A ratio:  0.69         Systemic VTI:  0.17 m                             Systemic Diam: 2.10 cm Armanda Magic MD Electronically signed by Armanda Magic MD Signature Date/Time: 07/02/2020/11:10:29 AM    Final      Assessment/Plan: 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 Does the need for close, 24 hr/day medical supervision in concert with the patient's rehab needs make it unreasonable for this patient to be served in a less intensive setting? Yes 2. Co-Morbidities requiring supervision/potential complications: tobacco use 3. Due to bladder management, bowel management, safety, skin/wound care, disease management, medication administration, pain management and patient education, does the patient require 24 hr/day rehab nursing? Yes 4. Does the patient require coordinated care of a physician, rehab nurse, therapy disciplines of PT< OT to address physical and functional deficits in the context of the above medical diagnosis(es)? Yes Addressing deficits in the following areas: balance, endurance, locomotion, strength, transferring, bowel/bladder control, bathing, dressing, toileting and psychosocial support 5. Can the patient actively participate in an intensive therapy program of at least 3 hrs of therapy per day at least 5 days per week? Yes 6. The potential for patient to make measurable gains while on inpatient rehab is excellent 7. Anticipated functional outcomes upon discharge from inpatient rehab are modified independent  with PT, modified independent with OT, n/a with  SLP. 8. Estimated rehab length of stay to reach the above functional goals is: 7-10d 9. Anticipated discharge destination: Home 10. Overall Rehab/Functional Prognosis: excellent  RECOMMENDATIONS: This patient's condition is appropriate for continued rehabilitative care in the following setting: CIR Patient has agreed to participate in recommended program. Yes Note that insurance  prior authorization may be required for reimbursement for recommended care.  Comment: Hold off on CIR until RIght upper ext weakness is re evaluated.  Had 5/5 proximal RUE strength yesterday with 4-/5 grip per neuro , today is 3/5 , neuro has signed off   Charlton Amor, PA-C 07/04/2020   "I have personally performed a face to face diagnostic evaluation of this patient.  Additionally, I have reviewed and concur with the physician assistant's documentation above." Erick Colace M.D. [facility] Fellow Am Acad of Phys Med and Rehab Diplomate Am Board of Electrodiagnostic Med Fellow Am Board of Interventional Pain

## 2020-07-02 NOTE — ED Notes (Signed)
Patient transported to MRI 

## 2020-07-02 NOTE — Progress Notes (Signed)
Sleeping but easily arousable.  Reports right side is improved.  He has mild drift of the right upper extremity and right legs but normal strength on direct testing.  MRI reviewed in person.  Work-up pending.

## 2020-07-02 NOTE — Progress Notes (Signed)
Inpatient Rehab Admissions Coordinator:  Consult received. Note pt is under observation status at this time. Pt may not have the medical necessity to warrant an inpatient rehab stay if they remain observation. If status were to change to inpatient, AC will assess for candidacy.   Mayetta Castleman Graves Madden, MS, CCC-SLP Admissions Coordinator [phone]  

## 2020-07-02 NOTE — ED Notes (Signed)
Neuro at  The bedside 

## 2020-07-02 NOTE — Consult Note (Signed)
NEURO HOSPITALIST CONSULT NOTE   Requesting physician: Dr. Nicanor Alcon  Reason for Consult: New onset of slurred speech and right sided weakness  History obtained from:  Patient and Chart     HPI:                                                                                                                                          Jeremy Knapp is an 70 y.o. male who presents to the ED with RLE weakness, first noticed at about 6 PM on Friday when he started dragging his right leg while walking. He was traveling by train from [place] and had had intermittent right sided weakness while on the train. He has had 2 falls since the weakness was noticed. He also endorses some right arm weakness and dysarthria. He denies facial weakness, difficulty understanding speech or confusion. He does not endorse any vision changes.  He has no prior history of stroke.   History reviewed. No pertinent past medical history.  Past Surgical History:  Procedure Laterality Date  . FRACTURE SURGERY      History reviewed. No pertinent family history.            Social History:  reports that he has been smoking. He has never used smokeless tobacco. He reports that he does not drink alcohol and does not use drugs.  No Known Allergies  MEDICATIONS:                                                                                                                     No medications listed in Epic.    ROS:  As per HPI. Does not endorse additional symptoms.    Blood pressure 139/88, pulse 82, temperature 98.1 F (36.7 C), temperature source Oral, resp. rate 18, height 6' (1.829 m), weight 86.2 kg, SpO2 98 %.   General Examination:                                                                                                       Physical Exam  HEENT-  [place]/AT    Lungs-  Respirations unlabored Extremities- No edema  Neurological Examination Mental Status: Awake and alert. Speech fluent with intact naming and comprehension. Subtle dysarthria. Oriented x 5.  Cranial Nerves: II: Temporal visual fields intact with no extinction to DSS. PERRL. III,IV, VI: EOMI. No ptosis or nystagmus.   V,VII: Subtle right facial weakness during speech and when making facial expressions. Temp sensation equal bilaterally.  VIII: Hearing intact to voice IX,X: No hypophonia XI: Head is midline XII: Midline tongue extension Motor: RUE 4-/5 RLE 4/5 LUE and LLE 5/5 Sensory: Decreased temp sensation to LUE. Decreased temp sensation to RLE. FT intact x 4. No extinction to DSS.  Deep Tendon Reflexes:  Slightly hypoactive reflexes on the right Plantars: Right: downgoing   Left: downgoing Cerebellar: Subtle dystaxia with FNF on the right.  Gait: Deferred   Lab Results: Basic Metabolic Panel: Recent Labs  Lab 07/02/20 0315 07/02/20 0324  NA 137 139  K 4.2 4.1  CL 103 104  CO2 25  --   GLUCOSE 153* 153*  BUN 16 17  CREATININE 1.02 1.00  CALCIUM 9.6  --     CBC: Recent Labs  Lab 07/02/20 0315 07/02/20 0324  WBC 4.4  --   NEUTROABS 2.4  --   HGB 15.2 14.6  HCT 45.4 43.0  MCV 94.4  --   PLT 192  --     Cardiac Enzymes: No results for input(s): CKTOTAL, CKMB, CKMBINDEX, TROPONINI in the last 168 hours.  Lipid Panel: No results for input(s): CHOL, TRIG, HDL, CHOLHDL, VLDL, LDLCALC in the last 168 hours.  Imaging: CT Head Wo Contrast  Result Date: 07/02/2020 CLINICAL DATA:  Facial trauma, weakness EXAM: CT HEAD WITHOUT CONTRAST TECHNIQUE: Contiguous axial images were obtained from the base of the skull through the vertex without intravenous contrast. COMPARISON:  None. FINDINGS: Brain: No evidence of large-territorial acute infarction. No parenchymal hemorrhage. No mass lesion. No extra-axial collection. No mass effect or midline shift. No hydrocephalus. Basilar  cisterns are patent. Vascular: No hyperdense vessel. Atherosclerotic calcifications are present within the cavernous internal carotid arteries. Skull: No acute fracture or focal lesion. Sinuses/Orbits: Paranasal sinuses and mastoid air cells are clear. The orbits are unremarkable. Other: None. IMPRESSION: No acute intracranial abnormality. Electronically Signed   By: Tish Frederickson M.D.   On: 07/02/2020 04:35    Assessment: 70 year old male with new onset of right sided weakness and slurred speech 1. Exam reveals findings referable to a left hemisphere lesion, most likely an acute stroke.  2. CT head: No acute abnormality.   Recommendations: 1. HgbA1c, fasting lipid panel 2. MRI  of the brain without contrast 3. PT consult, OT consult, Speech consult 4. Echocardiogram 5. ASA 325 mg po x 1 now, then 81 mg po qd 6. CTA of head and neck  7. Risk factor modification 8. Telemetry monitoring 9. Frequent neuro checks 10 NPO until passes stroke swallow screen 11. Modified permissive HTN given patient's age. Treat if SBP > 200 12. Decision on statin pending MRI    Electronically signed: Dr. Caryl Pina 07/02/2020, 5:52 AM

## 2020-07-02 NOTE — ED Provider Notes (Addendum)
[facility] EMERGENCY DEPARTMENT Provider Note   CSN: [id number] Arrival date & time: 07/02/20  0300     History Chief Complaint  Patient presents with  . Weakness    Jeremy Knapp is a 70 y.o. male.  The history is provided by the patient.  Weakness Severity:  Severe Onset quality:  Sudden Duration: since yesterday  Timing:  Constant Progression:  Unchanged Chronicity:  New Context: not allergies and not change in medication   Relieved by:  Nothing Worsened by:  Nothing Ineffective treatments:  None tried Associated symptoms: difficulty walking and stroke symptoms   Associated symptoms: no abdominal pain, no arthralgias, no chest pain, no cough, no diarrhea, no dysuria, no fever, no headaches, no loss of consciousness, no shortness of breath, no syncope, no vision change and no vomiting   Associated symptoms comment:  Dysarthria and falls x 2 secondary to weakness Risk factors: no anemia   Patient with no PMH presents with dysarthria      History reviewed. No pertinent past medical history.  There are no problems to display for this patient.   Past Surgical History:  Procedure Laterality Date  . FRACTURE SURGERY         History reviewed. No pertinent family history.  Social History   Tobacco Use  . Smoking status: Current Some Day Smoker  . Smokeless tobacco: Never Used  Substance Use Topics  . Alcohol use: Never  . Drug use: Never    Home Medications Prior to Admission medications   Not on File    Allergies    Patient has no known allergies.  Review of Systems   Review of Systems  Constitutional: Negative for fever.  HENT: Negative for congestion.   Eyes: Negative for visual disturbance.  Respiratory: Negative for cough and shortness of breath.   Cardiovascular: Negative for chest pain and syncope.  Gastrointestinal: Negative for abdominal pain, diarrhea and vomiting.  Genitourinary: Negative for dysuria.   Musculoskeletal: Negative for arthralgias.  Skin: Negative for rash.  Neurological: Positive for speech difficulty and weakness. Negative for loss of consciousness and headaches.  Psychiatric/Behavioral: Negative for agitation.  All other systems reviewed and are negative.   Physical Exam Updated Vital Signs BP 139/88   Pulse 82   Temp 98.1 F (36.7 C) (Oral)   Resp 18   Ht 6' (1.829 m)   Wt 86.2 kg   SpO2 98%   BMI 25.77 kg/m   Physical Exam Vitals and nursing note reviewed.  Constitutional:      General: He is not in acute distress.    Appearance: Normal appearance.  HENT:     Head: Normocephalic and atraumatic.     Nose: Nose normal.     Mouth/Throat:     Mouth: Mucous membranes are moist.     Pharynx: Oropharynx is clear.  Eyes:     Extraocular Movements: Extraocular movements intact.     Conjunctiva/sclera: Conjunctivae normal.     Pupils: Pupils are equal, round, and reactive to light.  Cardiovascular:     Rate and Rhythm: Normal rate and regular rhythm.     Pulses: Normal pulses.     Heart sounds: Normal heart sounds.  Pulmonary:     Effort: Pulmonary effort is normal.     Breath sounds: Normal breath sounds.  Abdominal:     General: Abdomen is flat. Bowel sounds are normal.     Palpations: Abdomen is soft.     Tenderness: There is no abdominal  tenderness. There is no guarding.  Musculoskeletal:        General: Normal range of motion.     Cervical back: Normal range of motion and neck supple.     Comments: 4+ RLE , 5-/5 RUE  Skin:    General: Skin is warm and dry.     Capillary Refill: Capillary refill takes less than 2 seconds.  Neurological:     General: No focal deficit present.     Mental Status: He is alert.     Deep Tendon Reflexes: Reflexes normal.  Psychiatric:        Mood and Affect: Mood normal.        Behavior: Behavior normal.     ED Results / Procedures / Treatments   Labs (all labs ordered are listed, but only abnormal results  are displayed) Results for orders placed or performed during the hospital encounter of 07/02/20  Basic metabolic panel  Result Value Ref Range   Sodium 137 135 - 145 mmol/L   Potassium 4.2 3.5 - 5.1 mmol/L   Chloride 103 98 - 111 mmol/L   CO2 25 22 - 32 mmol/L   Glucose, Bld 153 (H) 70 - 99 mg/dL   BUN 16 8 - 23 mg/dL   Creatinine, Ser 1.74 0.61 - 1.24 mg/dL   Calcium 9.6 8.9 - 94.4 mg/dL   GFR, Estimated >96 >75 mL/min   Anion gap 9 5 - 15  CBC  Result Value Ref Range   WBC 4.4 4.0 - 10.5 K/uL   RBC 4.81 4.22 - 5.81 MIL/uL   Hemoglobin 15.2 13.0 - 17.0 g/dL   HCT 91.6 38.4 - 66.5 %   MCV 94.4 80.0 - 100.0 fL   MCH 31.6 26.0 - 34.0 pg   MCHC 33.5 30.0 - 36.0 g/dL   RDW 99.3 57.0 - 17.7 %   Platelets 192 150 - 400 K/uL   nRBC 0.0 0.0 - 0.2 %  Urinalysis, Routine w reflex microscopic Urine, Clean Catch  Result Value Ref Range   Color, Urine YELLOW YELLOW   APPearance CLEAR CLEAR   Specific Gravity, Urine 1.013 1.005 - 1.030   pH 6.0 5.0 - 8.0   Glucose, UA NEGATIVE NEGATIVE mg/dL   Hgb urine dipstick NEGATIVE NEGATIVE   Bilirubin Urine NEGATIVE NEGATIVE   Ketones, ur NEGATIVE NEGATIVE mg/dL   Protein, ur NEGATIVE NEGATIVE mg/dL   Nitrite NEGATIVE NEGATIVE   Leukocytes,Ua TRACE (A) NEGATIVE   RBC / HPF 0-5 0 - 5 RBC/hpf   WBC, UA 0-5 0 - 5 WBC/hpf   Bacteria, UA NONE SEEN NONE SEEN   Squamous Epithelial / LPF 0-5 0 - 5   Mucus PRESENT   Protime-INR  Result Value Ref Range   Prothrombin Time 12.8 11.4 - 15.2 seconds   INR 1.0 0.8 - 1.2  APTT  Result Value Ref Range   aPTT 27 24 - 36 seconds  Differential  Result Value Ref Range   Neutrophils Relative % 54 %   Neutro Abs 2.4 1.7 - 7.7 K/uL   Lymphocytes Relative 26 %   Lymphs Abs 1.2 0.7 - 4.0 K/uL   Monocytes Relative 15 %   Monocytes Absolute 0.7 0.1 - 1.0 K/uL   Eosinophils Relative 4 %   Eosinophils Absolute 0.2 0.0 - 0.5 K/uL   Basophils Relative 1 %   Basophils Absolute 0.0 0.0 - 0.1 K/uL   Immature  Granulocytes 0 %   Abs Immature Granulocytes 0.01 0.00 - 0.07 K/uL  CBG monitoring, ED  Result Value Ref Range   Glucose-Capillary 150 (H) 70 - 99 mg/dL  CBG monitoring, ED  Result Value Ref Range   Glucose-Capillary 164 (H) 70 - 99 mg/dL  I-stat chem 8, ED  Result Value Ref Range   Sodium 139 135 - 145 mmol/L   Potassium 4.1 3.5 - 5.1 mmol/L   Chloride 104 98 - 111 mmol/L   BUN 17 8 - 23 mg/dL   Creatinine, Ser 6.94 0.61 - 1.24 mg/dL   Glucose, Bld 854 (H) 70 - 99 mg/dL   Calcium, Ion 6.27 0.35 - 1.40 mmol/L   TCO2 24 22 - 32 mmol/L   Hemoglobin 14.6 13.0 - 17.0 g/dL   HCT 00.9 38.1 - 82.9 %   CT Head Wo Contrast  Result Date: 07/02/2020 CLINICAL DATA:  Facial trauma, weakness EXAM: CT HEAD WITHOUT CONTRAST TECHNIQUE: Contiguous axial images were obtained from the base of the skull through the vertex without intravenous contrast. COMPARISON:  None. FINDINGS: Brain: No evidence of large-territorial acute infarction. No parenchymal hemorrhage. No mass lesion. No extra-axial collection. No mass effect or midline shift. No hydrocephalus. Basilar cisterns are patent. Vascular: No hyperdense vessel. Atherosclerotic calcifications are present within the cavernous internal carotid arteries. Skull: No acute fracture or focal lesion. Sinuses/Orbits: Paranasal sinuses and mastoid air cells are clear. The orbits are unremarkable. Other: None. IMPRESSION: No acute intracranial abnormality. Electronically Signed   By: Tish Frederickson M.D.   On: 07/02/2020 04:35    EKG EKG Interpretation  Date/Time:  Saturday Baltazar [place] 23 2022 03:11:58 EDT Ventricular Rate:  85 PR Interval:  176 QRS Duration: 110 QT Interval:  382 QTC Calculation: 454 R Axis:   -32 Text Interpretation: Normal sinus rhythm Left axis deviation Confirmed by Nicanor Alcon, Lauretta Sallas ([id number]) on 07/02/2020 5:11:07 AM   Radiology CT Head Wo Contrast  Result Date: 07/02/2020 CLINICAL DATA:  Facial trauma, weakness EXAM: CT HEAD WITHOUT CONTRAST  TECHNIQUE: Contiguous axial images were obtained from the base of the skull through the vertex without intravenous contrast. COMPARISON:  None. FINDINGS: Brain: No evidence of large-territorial acute infarction. No parenchymal hemorrhage. No mass lesion. No extra-axial collection. No mass effect or midline shift. No hydrocephalus. Basilar cisterns are patent. Vascular: No hyperdense vessel. Atherosclerotic calcifications are present within the cavernous internal carotid arteries. Skull: No acute fracture or focal lesion. Sinuses/Orbits: Paranasal sinuses and mastoid air cells are clear. The orbits are unremarkable. Other: None. IMPRESSION: No acute intracranial abnormality. Electronically Signed   By: Tish Frederickson M.D.   On: 07/02/2020 04:35    Procedures Procedures   Medications Ordered in ED Medications  sodium chloride flush (NS) 0.9 % injection 3 mL (has no administration in time range)  aspirin tablet 325 mg (has no administration in time range)    ED Course  I have reviewed the triage vital signs and the nursing notes.  Pertinent labs & imaging results that were available during my care of the patient were reviewed by me and considered in my medical decision making (see chart for details).    Case d/w Dr. Otelia Limes who will see the patient in consult, he has seen the patient and believes this to be a stroke.   MRI of the brain ordered  Given symptoms with no PMD patient will require TIA CVA work up.   Final Clinical Impression(s) / ED Diagnoses Final diagnoses:  None   Admit to medicine    Shaolin Armas, MD 07/02/20 [id number]    Nicanor Alcon, Mckinzy Fuller,  MD 07/02/20 [id number]

## 2020-07-02 NOTE — Evaluation (Signed)
Physical Therapy Evaluation Patient Details Name: Jeremy Knapp MRN: [id number] DOB: 12-Apr-1950 Today's Date: 07/02/2020   History of Present Illness  70 y/o male presented to ED on 4/23 with slurred speech and R sided weakness. Patient witih 2 falls on 4/22 due to weakness. 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 No pertinent PMH.  Clinical Impression  Patient originally from [place] where he lives alone but traveling to see sister in [place]. Patient lives an active lifestyle with biking 20 miles/day. Patient presents with generalized weakness, impaired balance, decreased activity tolerance, and impaired functional mobility. Patient requires modA to perform sit to stand transfer and min-modA for ambulation with RW. Patient will benefit from skilled PT services during acute stay to address listed deficits. Recommend CIR following discharge as patient demos functional decline from baseline and would benefit from intensive therapies to assist with return to PLOF.     Follow Up Recommendations CIR    Equipment Recommendations  Rolling Saje Gallop with 5" wheels    Recommendations for Other Services Rehab consult     Precautions / Restrictions Precautions Precautions: Fall Restrictions Weight Bearing Restrictions: No      Mobility  Bed Mobility Overal bed mobility: Needs Assistance Bed Mobility: Supine to Sit     Supine to sit: Supervision;HOB elevated     General bed mobility comments: supervision for safety    Transfers Overall transfer level: Needs assistance Equipment used: None;Rolling Yalda Herd (2 wheeled) Transfers: Sit to/from Stand Sit to Stand: Mod assist         General transfer comment: modA to stand with no AD and with RW. Cues for hand placement  Ambulation/Gait Ambulation/Gait assistance: Min assist;Mod assist Gait Distance (Feet): 15 Feet Assistive device: Rolling Georgeann Brinkman (2 wheeled) Gait Pattern/deviations: Step-to pattern;Decreased stride  length;Decreased stance time - right;Decreased step length - left;Decreased weight shift to left;Trunk flexed;Wide base of support Gait velocity: decreased   General Gait Details: Tends to drag R foot behind due to weakness. Cues for increasing step length. MinA for straight path but modA for turns and close quarters.  Stairs            Wheelchair Mobility    Modified Rankin (Stroke Patients Only) Modified Rankin (Stroke Patients Only) Pre-Morbid Rankin Score: No symptoms Modified Rankin: Moderately severe disability     Balance Overall balance assessment: Needs assistance Sitting-balance support: No upper extremity supported;Feet supported Sitting balance-Leahy Scale: Fair     Standing balance support: Bilateral upper extremity supported;During functional activity Standing balance-Leahy Scale: Poor Standing balance comment: reliant on UE support and external assist                             Pertinent Vitals/Pain Pain Assessment: No/denies pain    Home Living Family/patient expects to be discharged to:: Private residence Living Arrangements: Alone (From [place], sister and brother lives here in [place]) Available Help at Discharge: Family;Available PRN/intermittently Type of Home: Apartment Home Access: Stairs to enter Entrance Stairs-Rails: Left Entrance Stairs-Number of Steps: 10 Home Layout: One level (apartment is in the basement) Home Equipment: None      Prior Function Level of Independence: Independent         Comments: 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 R weaker than L)    Cervical / Trunk Assessment Cervical / Trunk Assessment: Normal   Communication   Communication: No difficulties  Cognition Arousal/Alertness: Awake/alert Behavior During Therapy: Flat affect Overall Cognitive Status: Within Functional Limits for tasks assessed                                        General Comments      Exercises     Assessment/Plan    PT Assessment Patient needs continued PT services  PT Problem List Decreased strength;Decreased range of motion;Decreased activity tolerance;Decreased mobility;Decreased balance;Decreased safety awareness;Decreased knowledge of use of DME       PT Treatment Interventions DME instruction;Gait training;Functional mobility training;Stair training;Therapeutic activities;Therapeutic exercise;Balance training;Neuromuscular re-education;Patient/family education    PT Goals (Current goals can be found in the Care Plan section)  Acute Rehab PT Goals Patient Stated Goal: to get better and back to normal PT Goal Formulation: With patient Time For Goal Achievement: 07/16/20 Potential to Achieve Goals: Good    Frequency Min 4X/week   Barriers to discharge        Co-evaluation               AM-PAC PT "6 Clicks" Mobility  Outcome Measure Help needed turning from your back to your side while in a flat bed without using bedrails?: A Little Help needed moving from lying on your back to sitting on the side of a flat bed without using bedrails?: A Little Help needed moving to and from a bed to a chair (including a wheelchair)?: A Little Help needed standing up from a chair using your arms (e.g., wheelchair or bedside chair)?: A Lot Help needed to walk in hospital room?: A Lot Help needed climbing 3-5 steps with a railing? : A Lot 6 Click Score: 15    End of Session Equipment Utilized During Treatment: Gait belt Activity Tolerance: Patient tolerated treatment well Patient left: in chair;with call bell/phone within reach;with chair alarm set Nurse Communication: Mobility  status PT Visit Diagnosis: Unsteadiness on feet (R26.81);Muscle weakness (generalized) (M62.81)    Time: 4562-5638 PT Time Calculation (min) (ACUTE ONLY): 34 min   Charges:   PT Evaluation $PT Eval Moderate Complexity: 1 Mod          Hallelujah Wysong A. Dan Humphreys PT, DPT Acute Rehabilitation Services Pager [phone] Office [phone]   Viviann Spare 07/02/2020, 4:52 PM

## 2020-07-02 NOTE — Progress Notes (Addendum)
New Admission Note:  Arrival Method: via stretcher from [facility] ED Mental Orientation: alert and oriented to person, place time and situation Telemetry: yes Assessment: Completed Skin: skin is clean, dry and intact. IV: Rt FA Pain: Pt does not complain of any pain  Safety Measures: Safety Fall Prevention Plan was given, discussed. Admission: Completed 3W: Patient has been orientated to the room, unit and the staff. Family: No family at bedside at this time.   Orders have been reviewed and implemented. Will continue to monitor the patient. Call light has been placed within reach and bed alarm has been activated.   Kathrynn Humble ,RN

## 2020-07-02 NOTE — Progress Notes (Signed)
Initial Nutrition Assessment  RD working remotely.  DOCUMENTATION CODES:   Not applicable  INTERVENTION:   - Ensure Enlive po BID, each supplement provides 350 kcal and 20 grams of protein  - MVI with minerals daily  NUTRITION DIAGNOSIS:   Increased nutrient needs related to acute illness as evidenced by estimated needs.  GOAL:   Patient will meet greater than or equal to 90% of their needs  MONITOR:   PO intake,Supplement acceptance,Labs,Weight trends  REASON FOR ASSESSMENT:   Consult Assessment of nutrition requirement/status  ASSESSMENT:   70 year old male who presented to the ED on 4/23 with slurred speech, right-sided weakness. PMH of tobacco use. Pt admitted with possible TIA.   Spoke with pt via phone call to room. Pt reports that he was sleeping when he heard the phone ringing but is amenable to answering RD questions.  Pt denies any recent changes in appetite or PO intake. He reports that he consumes 1 meal a day on average but yesterday ate 2 meals. He states that he ate breakfast this morning and did well but did not feel like eating lunch because he wasn't hungry.  Pt denies any recent changes in weight. He reports his UBW as 180-190 lbs. Weight on admission is 190 lbs but appears to be stated rather than measured. No weight history available in chart.  Pt amenable to RD ordering oral nutrition supplements and daily MVI. Pt states that he would like to drink a supplement if he does not feel like eating a meal. Discussed importance of adequate PO intake with a focus on protein-rich foods in maintaining lean muscle mass. Pt expresses understanding.  Medications reviewed and include: NS @ 50 ml/hr  Labs reviewed. CBG's: 150-164  NUTRITION - FOCUSED PHYSICAL EXAM:  Unable to complete at this time. RD working remotely.  Diet Order:   Diet Order            Diet Heart Room service appropriate? Yes; Fluid consistency: Thin  Diet effective ____                  EDUCATION NEEDS:   Education needs have been addressed  Skin:  Skin Assessment: Reviewed RN Assessment  Last BM:  no documented BM  Height:   Ht Readings from Last 1 Encounters:  07/02/20 6' (1.829 m)    Weight:   Wt Readings from Last 1 Encounters:  07/02/20 86.2 kg    BMI:  Body mass index is 25.77 kg/m.  Estimated Nutritional Needs:   Kcal:  2000-2200  Protein:  90-110 grams  Fluid:  >/= 2.0 L    Mertie Clause, MS, RD, LDN Inpatient Clinical Dietitian Please see AM[facility] for contact information.

## 2020-07-02 NOTE — ED Triage Notes (Signed)
Patient reports slurred speech, R sided weakness, states he has been dragging his R foot since yesterday, reports 2 falls since symptom onset.

## 2020-07-02 NOTE — Progress Notes (Signed)
  Echocardiogram 2D Echocardiogram has been performed.  Delcie Roch 07/02/2020, 10:40 AM

## 2020-07-03 DIAGNOSIS — R29706 NIHSS score 6: Secondary | ICD-10-CM | POA: Diagnosis not present

## 2020-07-03 DIAGNOSIS — I639 Cerebral infarction, unspecified: Secondary | ICD-10-CM | POA: Diagnosis present

## 2020-07-03 DIAGNOSIS — R2990 Unspecified symptoms and signs involving the nervous system: Secondary | ICD-10-CM | POA: Diagnosis not present

## 2020-07-03 DIAGNOSIS — I444 Left anterior fascicular block: Secondary | ICD-10-CM | POA: Diagnosis present

## 2020-07-03 DIAGNOSIS — G8191 Hemiplegia, unspecified affecting right dominant side: Secondary | ICD-10-CM | POA: Diagnosis present

## 2020-07-03 DIAGNOSIS — Z20822 Contact with and (suspected) exposure to covid-19: Secondary | ICD-10-CM | POA: Diagnosis present

## 2020-07-03 DIAGNOSIS — R29707 NIHSS score 7: Secondary | ICD-10-CM | POA: Diagnosis not present

## 2020-07-03 DIAGNOSIS — R29703 NIHSS score 3: Secondary | ICD-10-CM | POA: Diagnosis not present

## 2020-07-03 DIAGNOSIS — I10 Essential (primary) hypertension: Secondary | ICD-10-CM | POA: Diagnosis present

## 2020-07-03 DIAGNOSIS — I6502 Occlusion and stenosis of left vertebral artery: Secondary | ICD-10-CM | POA: Diagnosis present

## 2020-07-03 DIAGNOSIS — R4781 Slurred speech: Secondary | ICD-10-CM | POA: Diagnosis present

## 2020-07-03 DIAGNOSIS — I6381 Other cerebral infarction due to occlusion or stenosis of small artery: Secondary | ICD-10-CM | POA: Diagnosis present

## 2020-07-03 DIAGNOSIS — R29702 NIHSS score 2: Secondary | ICD-10-CM | POA: Diagnosis not present

## 2020-07-03 DIAGNOSIS — K5900 Constipation, unspecified: Secondary | ICD-10-CM | POA: Diagnosis present

## 2020-07-03 DIAGNOSIS — E041 Nontoxic single thyroid nodule: Secondary | ICD-10-CM | POA: Diagnosis present

## 2020-07-03 DIAGNOSIS — R29704 NIHSS score 4: Secondary | ICD-10-CM | POA: Diagnosis not present

## 2020-07-03 DIAGNOSIS — F17210 Nicotine dependence, cigarettes, uncomplicated: Secondary | ICD-10-CM | POA: Diagnosis present

## 2020-07-03 DIAGNOSIS — E785 Hyperlipidemia, unspecified: Secondary | ICD-10-CM | POA: Diagnosis present

## 2020-07-03 DIAGNOSIS — R29705 NIHSS score 5: Secondary | ICD-10-CM | POA: Diagnosis present

## 2020-07-03 MED ORDER — CLOPIDOGREL BISULFATE 75 MG PO TABS: 75.0000 mg | ORAL_TABLET | Freq: Every day | ORAL | Status: DC

## 2020-07-03 MED ORDER — CLOPIDOGREL BISULFATE 75 MG PO TABS
75.0000 mg | ORAL_TABLET | Freq: Every day | ORAL | Status: DC
  Administered 2020-07-03 – 2020-07-08 (×6): 75 mg via ORAL
  Filled 2020-07-03 (×6): qty 1

## 2020-07-03 NOTE — Progress Notes (Addendum)
STROKE PROGRESS  Jeremy Knapp is an 70 y.o. male.   Assessment/Plan: 1.  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  This is a lacunar event.  The patient was not on any antithrombotic agents but now is on dual antiplatelet agents.  We actually may recommend dual antiplatelet agents for 3 weeks and then afterwards aspirin 81 mg.  Statin medication and blood pressure control are recommended.  He has been evaluated for inpatient rehab.  We will sign off for now.  Reconsult as needed.  Follow-up with stroke service in 6 weeks.  He reports that he thinks her right arm is a little weak although on exam it is unchanged.  GENERAL: He is doing well at this time.  HEENT: Normal  ABDOMEN: soft  EXTREMITIES: No edema   BACK: Normal  SKIN: Normal by inspection.    MENTAL STATUS: Alert and oriented. Speech, language and cognition are generally intact. Judgment and insight normal.   CRANIAL NERVES: Pupils are equal, round and reactive to light and accomodation; extra ocular movements are full, there is no significant nystagmus; visual fields are full; upper and lower facial muscles are normal in strength and symmetric, there is no flattening of the nasolabial folds; tongue is midline; uvula is midline; shoulder elevation is normal.  MOTOR: There is mild drift of the right upper extremity.  Other extremities shows no drift.  Proximal strength (deltoid tricep) is 5 of the right upper extremity but hand is 4 -/5.  The other extremity shows normal tone, bulk and strength.    COORDINATION: Left finger to nose is normal, right finger to nose is normal, No rest tremor; no intention tremor; no postural tremor; no bradykinesia.  SENSATION: Normal to light touch, temperature, and pain.     Objective: Vital signs in last 24 hours: Temp:  [97.9 F (36.6 C)-98.6 F (37 C)] 98.6 F (37 C) (04/24 0754) Pulse Rate:  [70-87] 85 (04/24 0754) Resp:   [18-20] 18 (04/24 0754) BP: (112-143)/(75-99) 138/91 (04/24 0754) SpO2:  [96 %-100 %] 98 % (04/24 0754)  Intake/Output from previous day: 04/23 0701 - 04/24 0700 In: 220.7 [I.V.:220.7] Out: 750 [Urine:750] Intake/Output this shift: No intake/output data recorded. Nutritional status:  Diet Order            Diet Heart Room service appropriate? Yes; Fluid consistency: Thin  Diet effective ____                  Lab Results: Results for orders placed or performed during the hospital encounter of 07/02/20 (from the past 48 hour(s))  CBG monitoring, ED     Status: Abnormal   Collection Time: 07/02/20  3:05 AM  Result Value Ref Range   Glucose-Capillary 150 (H) 70 - 99 mg/dL    Comment: Glucose reference range applies only to samples taken after fasting for at least 8 hours.  Urinalysis, Routine w reflex microscopic Urine, Clean Catch     Status: Abnormal   Collection Time: 07/02/20  3:09 AM  Result Value Ref Range   Color, Urine YELLOW YELLOW   APPearance CLEAR CLEAR   Specific Gravity, Urine 1.013 1.005 - 1.030   pH 6.0 5.0 - 8.0   Glucose, UA NEGATIVE NEGATIVE mg/dL   Hgb urine dipstick NEGATIVE NEGATIVE   Bilirubin Urine NEGATIVE NEGATIVE   Ketones, ur NEGATIVE NEGATIVE mg/dL   Protein, ur NEGATIVE NEGATIVE mg/dL   Nitrite NEGATIVE NEGATIVE   Leukocytes,Ua TRACE (A) NEGATIVE  RBC / HPF 0-5 0 - 5 RBC/hpf   WBC, UA 0-5 0 - 5 WBC/hpf   Bacteria, UA NONE SEEN NONE SEEN   Squamous Epithelial / LPF 0-5 0 - 5   Mucus PRESENT     Comment: Performed at [facility], [address] [place], [place] [address]  Basic metabolic panel     Status: Abnormal   Collection Time: 07/02/20  3:15 AM  Result Value Ref Range   Sodium 137 135 - 145 mmol/L   Potassium 4.2 3.5 - 5.1 mmol/L   Chloride 103 98 - 111 mmol/L   CO2 25 22 - 32 mmol/L   Glucose, Bld 153 (H) 70 - 99 mg/dL    Comment: Glucose reference range applies only to samples taken after fasting for at least 8 hours.    BUN 16 8 - 23 mg/dL   Creatinine, Ser 6.041.02 0.61 - 1.24 mg/dL   Calcium 9.6 8.9 - 54.010.3 mg/dL   GFR, Estimated >98>60 >11>60 mL/min    Comment: (NOTE) Calculated using the CKD-EPI Creatinine Equation (2021)    Anion gap 9 5 - 15    Comment: Performed at [facility], [address] [place], [place] [address]  CBC     Status: None   Collection Time: 07/02/20  3:15 AM  Result Value Ref Range   WBC 4.4 4.0 - 10.5 K/uL   RBC 4.81 4.22 - 5.81 MIL/uL   Hemoglobin 15.2 13.0 - 17.0 g/dL   HCT 29.545.4 62.139.0 - 30.852.0 %   MCV 94.4 80.0 - 100.0 fL   MCH 31.6 26.0 - 34.0 pg   MCHC 33.5 30.0 - 36.0 g/dL   RDW 65.712.5 84.611.5 - 96.215.5 %   Platelets 192 150 - 400 K/uL   nRBC 0.0 0.0 - 0.2 %    Comment: Performed at [facility], [address] [place], [place] [address]  Protime-INR     Status: None   Collection Time: 07/02/20  3:15 AM  Result Value Ref Range   Prothrombin Time 12.8 11.4 - 15.2 seconds   INR 1.0 0.8 - 1.2    Comment: (NOTE) INR goal varies based on device and disease states. Performed at [facility], [address] [place], [place] [address]   APTT     Status: None   Collection Time: 07/02/20  3:15 AM  Result Value Ref Range   aPTT 27 24 - 36 seconds    Comment: Performed at [facility], [address] [place], [place] [address]  Differential     Status: None   Collection Time: 07/02/20  3:15 AM  Result Value Ref Range   Neutrophils Relative % 54 %   Neutro Abs 2.4 1.7 - 7.7 K/uL   Lymphocytes Relative 26 %   Lymphs Abs 1.2 0.7 - 4.0 K/uL   Monocytes Relative 15 %   Monocytes Absolute 0.7 0.1 - 1.0 K/uL   Eosinophils Relative 4 %   Eosinophils Absolute 0.2 0.0 - 0.5 K/uL   Basophils Relative 1 %   Basophils Absolute 0.0 0.0 - 0.1 K/uL   Immature Granulocytes 0 %   Abs Immature Granulocytes 0.01 0.00 - 0.07 K/uL    Comment: Performed at [facility], [address] [place], [place] [address]  I-stat chem 8, ED     Status: Abnormal   Collection  Time: 07/02/20  3:24 AM  Result Value Ref Range   Sodium 139  135 - 145 mmol/L   Potassium 4.1 3.5 - 5.1 mmol/L   Chloride 104 98 - 111 mmol/L   BUN 17 8 - 23 mg/dL   Creatinine, Ser 4.78 0.61 - 1.24 mg/dL   Glucose, Bld 295 (H) 70 - 99 mg/dL    Comment: Glucose reference range applies only to samples taken after fasting for at least 8 hours.   Calcium, Ion 1.21 1.15 - 1.40 mmol/L   TCO2 24 22 - 32 mmol/L   Hemoglobin 14.6 13.0 - 17.0 g/dL   HCT 62.1 30.8 - 65.7 %  CBG monitoring, ED     Status: Abnormal   Collection Time: 07/02/20  3:40 AM  Result Value Ref Range   Glucose-Capillary 164 (H) 70 - 99 mg/dL    Comment: Glucose reference range applies only to samples taken after fasting for at least 8 hours.  Resp Panel by RT-PCR (Flu A&B, Covid) Nasopharyngeal Swab     Status: None   Collection Time: 07/02/20  5:30 AM   Specimen: Nasopharyngeal Swab; Nasopharyngeal(NP) swabs in vial transport medium  Result Value Ref Range   SARS Coronavirus 2 by RT PCR NEGATIVE NEGATIVE    Comment: (NOTE) SARS-CoV-2 target nucleic acids are NOT DETECTED.  The SARS-CoV-2 RNA is generally detectable in upper respiratory specimens during the acute phase of infection. The lowest concentration of SARS-CoV-2 viral copies this assay can detect is 138 copies/mL. A negative result does not preclude SARS-Cov-2 infection and should not be used as the sole basis for treatment or other patient management decisions. A negative result may occur with  improper specimen collection/handling, submission of specimen other than nasopharyngeal swab, presence of viral mutation(s) within the areas targeted by this assay, and inadequate number of viral copies(<138 copies/mL). A negative result must be combined with clinical observations, patient history, and epidemiological information. The expected result is Negative.  Fact Sheet for Patients:  BloggerCourse.com  Fact Sheet for Healthcare  Providers:  SeriousBroker.it  This test is no t yet approved or cleared by the [place] FDA and  has been authorized for detection and/or diagnosis of SARS-CoV-2 by FDA under an Emergency Use Authorization (EUA). This EUA will remain  in effect (meaning this test can be used) for the duration of the COVID-19 declaration under Section 564(b)(1) of the Act, 21 U.S.C.section 360bbb-3(b)(1), unless the authorization is terminated  or revoked sooner.       Influenza A by PCR NEGATIVE NEGATIVE   Influenza B by PCR NEGATIVE NEGATIVE    Comment: (NOTE) The Xpert Xpress SARS-CoV-2/FLU/RSV plus assay is intended as an aid in the diagnosis of influenza from Nasopharyngeal swab specimens and should not be used as a sole basis for treatment. Nasal washings and aspirates are unacceptable for Xpert Xpress SARS-CoV-2/FLU/RSV testing.  Fact Sheet for Patients: BloggerCourse.com  Fact Sheet for Healthcare Providers: SeriousBroker.it  This test is not yet approved or cleared by the [place] FDA and has been authorized for detection and/or diagnosis of SARS-CoV-2 by FDA under an Emergency Use Authorization (EUA). This EUA will remain in effect (meaning this test can be used) for the duration of the COVID-19 declaration under Section 564(b)(1) of the Act, 21 U.S.C. section 360bbb-3(b)(1), unless the authorization is terminated or revoked.  Performed at [facility], [address], [place], [place] [address]   Urine rapid drug screen (hosp performed)not at [facility]     Status: None   Collection Time: 07/02/20  9:22 AM  Result Value Ref Range  Opiates NONE DETECTED NONE DETECTED   Cocaine NONE DETECTED NONE DETECTED   Benzodiazepines NONE DETECTED NONE DETECTED   Amphetamines NONE DETECTED NONE DETECTED   Tetrahydrocannabinol NONE DETECTED NONE DETECTED   Barbiturates NONE DETECTED NONE DETECTED     Comment: (NOTE) DRUG SCREEN FOR MEDICAL PURPOSES ONLY.  IF CONFIRMATION IS NEEDED FOR ANY PURPOSE, NOTIFY LAB WITHIN 5 DAYS.  LOWEST DETECTABLE LIMITS FOR URINE DRUG SCREEN Drug Class                     Cutoff (ng/mL) Amphetamine and metabolites    1000 Barbiturate and metabolites    200 Benzodiazepine                 200 Tricyclics and metabolites     300 Opiates and metabolites        300 Cocaine and metabolites        300 THC                            50 Performed at [facility], [address] [place], [place] [address]   TSH     Status: None   Collection Time: 07/02/20  9:38 AM  Result Value Ref Range   TSH 0.419 0.350 - 4.500 uIU/mL    Comment: Performed by a 3rd Generation assay with a functional sensitivity of <=0.01 uIU/mL. Performed at [facility], [address] [place], [place] [address]     Lipid Panel No results for input(s): CHOL, TRIG, HDL, CHOLHDL, VLDL, LDLCALC in the last 72 hours.  Studies/Results:  HEAD NECK CTA IMPRESSION: 1. 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 2. Patent left vertebral artery with mild V4 segment stenosis. 3. Mild right supraclinoid ICA and left M1 stenoses. 4. Widely patent cervical carotid arteries. 5. 3.1 cm left thyroid nodule. Recommend thyroid US (ref: J Am Coll Radiol. 2015 Feb;12(2): 143-50).   BRAIN MRI IMPRESSION: Acute perforator infarct at the left corona radiata and caudate body.   TTE 1. Left ventricular ejection fraction, by estimation, is 50 to 55%. The  left ventricle has low normal function. The left ventricle has no regional  wall motion abnormalities. Left ventricular diastolic parameters are  consistent with Grade I diastolic  dysfunction (impaired relaxation). Global longitudinal strain performed  but not reported based on interpreter judgement due to suboptimal  tracking.  2. Right ventricular systolic function is normal. The right ventricular  size is  normal. There is normal pulmonary artery systolic pressure. The  estimated right ventricular systolic pressure is 20.0 mmHg.  3. The mitral valve is normal in structure. Trivial mitral valve  regurgitation. No evidence of mitral stenosis.  4. The aortic valve is tricuspid. Aortic valve regurgitation is not  visualized. Mild aortic valve sclerosis is present, with no evidence of  aortic valve stenosis.  5. The inferior vena cava is normal in size with greater than 50%  respiratory variability, suggesting right atrial pressure of 3 mmHg.     Medications:  Scheduled Meds: . aspirin EC  81 mg Oral Daily  . atorvastatin  40 mg Oral Daily  . enoxaparin (LOVENOX) injection  40 mg Subcutaneous Q24H  . feeding supplement  237 mL Oral BID BM  . multivitamin with minerals  1 tablet Oral Daily   Continuous Infusions: . sodium chloride 50 mL/hr at 07/02/20 0941   PRN Meds:.acetaminophen **OR** acetaminophen (TYLENOL) oral liquid 160  mg/5 mL **OR** acetaminophen, senna-docusate     LOS: 0 days   Melita Villalona A. Gerilyn Pilgrim, M.D.  Diplomate, Biomedical engineer of Psychiatry and Neurology ( Neurology).

## 2020-07-03 NOTE — Plan of Care (Signed)
  Problem: Education: Goal: Knowledge of General Education information will improve Description: Including pain rating scale, medication(s)/side effects and non-pharmacologic comfort measures Outcome: Progressing   Problem: Health Behavior/Discharge Planning: Goal: Ability to manage health-related needs will improve Outcome: Progressing   Problem: Clinical Measurements: Goal: Ability to maintain clinical measurements within normal limits will improve Outcome: Progressing Goal: Will remain free from infection Outcome: Progressing Goal: Diagnostic test results will improve Outcome: Progressing Goal: Respiratory complications will improve Outcome: Progressing Goal: Cardiovascular complication will be avoided Outcome: Progressing   Problem: Activity: Goal: Risk for activity intolerance will decrease Outcome: Progressing   Problem: Nutrition: Goal: Adequate nutrition will be maintained Outcome: Progressing   Problem: Coping: Goal: Level of anxiety will decrease Outcome: Progressing   Problem: Elimination: Goal: Will not experience complications related to bowel motility Outcome: Progressing Goal: Will not experience complications related to urinary retention Outcome: Progressing   Problem: Pain Managment: Goal: General experience of comfort will improve Outcome: Progressing   Problem: Safety: Goal: Ability to remain free from injury will improve Outcome: Progressing   Problem: Skin Integrity: Goal: Risk for impaired skin integrity will decrease Outcome: Progressing   Problem: Education: Goal: Knowledge of disease or condition will improve Outcome: Progressing Goal: Knowledge of secondary prevention will improve Outcome: Progressing Goal: Knowledge of patient specific risk factors addressed and post discharge goals established will improve Outcome: Progressing Goal: Individualized Educational Video(s) Outcome: Progressing   Problem: Health Behavior/Discharge  Planning: Goal: Ability to manage health-related needs will improve Outcome: Progressing   Problem: Self-Care: Goal: Ability to participate in self-care as condition permits will improve Outcome: Progressing   Problem: Ischemic Stroke/TIA Tissue Perfusion: Goal: Complications of ischemic stroke/TIA will be minimized Outcome: Progressing   

## 2020-07-03 NOTE — Progress Notes (Signed)
Inpatient Rehab Admissions:  Inpatient Rehab Consult received.  I met with patient at the bedside for rehabilitation assessment and to discuss goals and expectations of an inpatient rehab admission.  Pt acknowledged understanding of goals and expectations. Pt interested in pursuing CIR.  Pt gave permission to contact niece, Adriane.  Left message; awaiting return call.  Signed: Gayland Curry, [place], [place] Admissions Coordinator [phone]

## 2020-07-03 NOTE — Evaluation (Signed)
Speech Language Pathology Evaluation Patient Details Name: Jeremy Knapp MRN: [id number] DOB: 08/23/1950 Today's Date: 07/03/2020 Time: 7915-0569 SLP Time Calculation (min) (ACUTE ONLY): 15 min  Problem List:  Patient Active Problem List   Diagnosis Date Noted  . CVA (cerebral vascular accident) (HCC) 07/03/2020  . Acute CVA (cerebrovascular accident) (HCC) 07/02/2020  . Thyroid nodule greater than or equal to 1.5 cm in diameter incidentally noted on imaging study 07/02/2020   Past Medical History: History reviewed. No pertinent past medical history. Past Surgical History:  Past Surgical History:  Procedure Laterality Date  . FRACTURE SURGERY     HPI:  70 y/o male presented to ED on 4/23 with slurred speech and R sided weakness. Patient witih 2 falls on 4/22 due to weakness. 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 No pertinent PMH.   Assessment / Plan / Recommendation Clinical Impression  Patient presents with what appears to be a very mild cognitive impairment (impacting memory and higher level attention) and very mild expressive language impairment (intermittent word finding errors). SLP observed patient have mild delay with divergent naming and decreased delayed recall of words and recall of information from short story. He stated that he does fine remembering things that are important to him but not with things such as these testing questions which are more arbitrary. As patient was previously living independently and plan is for him to go to CIR for physical and occupational therapy, recommend assessment of higher level cognition and language at next venue of care.    SLP Assessment  SLP Recommendation/Assessment: All further Speech Lanaguage Pathology  needs can be addressed in the next venue of care SLP Visit Diagnosis: Cognitive communication deficit (R41.841)    Follow Up Recommendations  Inpatient Rehab    Frequency and Duration   N/A        SLP  Evaluation Cognition  Overall Cognitive Status: Impaired/Different from baseline Arousal/Alertness: Awake/alert Orientation Level: Oriented X4 Attention: Selective Selective Attention: Appears intact Memory: Impaired Memory Impairment: Other (comment);Retrieval deficit (recalled 3/5 words after 2 minute delay, recalled 3/4 delayed recall questions based on short story) Awareness: Appears intact Problem Solving: Appears intact Safety/Judgment: Appears intact       Comprehension  Auditory Comprehension Overall Auditory Comprehension: Appears within functional limits for tasks assessed    Expression Expression Primary Mode of Expression: Verbal Verbal Expression Overall Verbal Expression: Impaired Initiation: No impairment Repetition: No impairment Naming: No impairment Pragmatics: No impairment Non-Verbal Means of Communication: Not applicable Other Verbal Expression Comments: Patient scored in normal range for divergent naming task, however he did report intermittent word finding difficulty since CVA; SLP observed slight delay in performing divergent naming task.   Oral / Motor  Oral Motor/Sensory Function Overall Oral Motor/Sensory Function: Within functional limits   GO                   Angela Nevin, MA, CCC-SLP Speech Therapy [facility] Acute Rehab

## 2020-07-03 NOTE — Evaluation (Signed)
Occupational Therapy Evaluation Patient Details Name: Jeremy Knapp MRN: [id number] DOB: 1950/03/22 Today's Date: 07/03/2020    History of Present Illness 70 y/o male presented to ED on 4/23 with slurred speech and R sided weakness. Patient witih 2 falls on 4/22 due to weakness. 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 No pertinent PMH.   Clinical Impression   Pt PTA: pt living independently in [place], [place] and very active lifestyle; still working. Pt currently, limited by decreased strength, coordination and decreased ability to care for self with RUE weakness. Pt dragging RLE at times and cues to stay in RW for mobility in room with minA, use of RW, only walking 20' at a time and reports feeling wiped out afterwards.  Education for using RUE for all functional tasks performed. Pt given squeeze ball and 1 built up utensil holder. Pt is a great candidate for CIR as he is highly motivated to return to PLOF and requires increased assist for mobility and ADL. Pt would greatly benefit from continued OT skilled services. OT following acutely.    Follow Up Recommendations  CIR    Equipment Recommendations  None recommended by OT    Recommendations for Other Services Rehab consult     Precautions / Restrictions Precautions Precautions: Fall Restrictions Weight Bearing Restrictions: No      Mobility Bed Mobility Overal bed mobility: Needs Assistance Bed Mobility: Supine to Sit     Supine to sit: Supervision;HOB elevated     General bed mobility comments: supervision for safety    Transfers Overall transfer level: Needs assistance Equipment used: None;Rolling walker (2 wheeled) Transfers: Sit to/from Stand Sit to Stand: Min assist         General transfer comment: MinA with RW in front; minA for stability with standing. Cues for hand placement    Balance Overall balance assessment: Needs assistance Sitting-balance support: No upper extremity supported;Feet  supported Sitting balance-Leahy Scale: Fair     Standing balance support: Bilateral upper extremity supported;During functional activity Standing balance-Leahy Scale: Poor Standing balance comment: reliant on UE support and external assist                           ADL either performed or assessed with clinical judgement   ADL Overall ADL's : Needs assistance/impaired Eating/Feeding: Set up;Sitting Eating/Feeding Details (indicate cue type and reason): pt given built up handle for self feeding for RUE; assist required to bring fork to mouth due to weakness and poor coordination Grooming: Minimal assistance;Standing Grooming Details (indicate cue type and reason): leaning on counter, increased time to open.close containers Upper Body Bathing: Minimal assistance;Sitting   Lower Body Bathing: Moderate assistance;Sitting/lateral leans;Sit to/from stand   Upper Body Dressing : Minimal assistance;Sitting;Standing   Lower Body Dressing: Moderate assistance;Cueing for safety   Toilet Transfer: Minimal assistance;+2 for physical assistance;+2 for safety/equipment;Ambulation;RW Toilet Transfer Details (indicate cue type and reason): pt requiring cues to avoid obstacles and to keep RLE from dragging Toileting- Clothing Manipulation and Hygiene: Moderate assistance;Sitting/lateral lean;Sit to/from stand Toileting - Clothing Manipulation Details (indicate cue type and reason): pt sat on commode, but unable to haev BM- pt wanting laxative.     Functional mobility during ADLs: Minimal assistance;Rolling walker;Cueing for safety;Cueing for sequencing General ADL Comments: Pt limited by decreased strength, coordination and decreased ability to care for self with RUE weakness. Pt dragging RLE at times and cues to stay in RW for mobility in room.  Vision Baseline Vision/History: No visual deficits Patient Visual Report: No change from baseline Vision Assessment?: No apparent visual  deficits     Perception     Praxis      Pertinent Vitals/Pain       Hand Dominance Right   Extremity/Trunk Assessment Upper Extremity Assessment Upper Extremity Assessment: Generalized weakness;RUE deficits/detail RUE Deficits / Details: strength 2-/5 MM grade, fair grip strength; assist to place on RW RUE Coordination: decreased fine motor;decreased gross motor   Lower Extremity Assessment Lower Extremity Assessment: Defer to PT evaluation;Generalized weakness;RLE deficits/detail RLE Deficits / Details: weakness   Cervical / Trunk Assessment Cervical / Trunk Assessment: Normal   Communication Communication Communication: No difficulties (speech is clear)   Cognition Arousal/Alertness: Awake/alert Behavior During Therapy: Flat affect Overall Cognitive Status: Within Functional Limits for tasks assessed                                 General Comments: Pt slightly frustrated with diagnosis wanting to know why and how he can prevent another CVA.   General Comments  pt ambulating 20' x2 in room from bed to bathroom to recliner and reports feeling "wiped out" Pt using RW. Education for using RUE for all functional tasks performed. Pt given squeeze ball and 1 built up utensil holder.    Exercises     Shoulder Instructions      Home Living Family/patient expects to be discharged to:: Private residence Living Arrangements: Alone (From [place], sister and brother lives here in [place]) Available Help at Discharge: Family;Available PRN/intermittently Type of Home: Apartment Home Access: Stairs to enter Entrance Stairs-Number of Steps: 10 Entrance Stairs-Rails: Left Home Layout: One level (apartment is in the basement)     Bathroom Shower/Tub: Chief Strategy Officer: Standard Bathroom Accessibility: Yes How Accessible: Accessible via walker Home Equipment: None   Additional Comments: Pt lives and works in [place], [place], but was visiting sister via  10 hour train ride.      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[employer], [employer] transportation, rides bike 20 [place]day in [place]        OT Problem List: Decreased activity tolerance;Impaired balance (sitting and/or standing);Decreased cognition;Decreased coordination;Decreased strength;Pain;Increased edema;Cardiopulmonary status limiting activity;Decreased safety awareness;Impaired UE functional use      OT Treatment/Interventions: Self-care/ADL training;Therapeutic exercise;Neuromuscular education;Energy conservation;DME and/or AE instruction;Therapeutic activities;Visual/perceptual remediation/compensation;Patient/family education;Balance training    OT Goals(Current goals can be found in the care plan section) Acute Rehab OT Goals Patient Stated Goal: to get better and back to normal OT Goal Formulation: With patient Time For Goal Achievement: 07/17/20 Potential to Achieve Goals: Good ADL Goals Pt Will Perform Eating: with set-up;with adaptive utensils;sitting Pt Will Perform Grooming: with supervision;standing Pt Will Transfer to Toilet: ambulating;regular height toilet;with min guard assist Pt/caregiver will Perform Home Exercise Program: Right Upper extremity;With theraputty;With theraband;With Supervision Additional ADL Goal #1: Pt will increase to supervisionA for OOB ADL x10 mins with minimal cues.  OT Frequency: Min 2X/week   Barriers to D/C:            Co-evaluation              AM-PAC OT "6 Clicks" Daily Activity     Outcome Measure Help from another person eating meals?: A Little Help from another person taking care of personal grooming?: A Little Help from another person  toileting, which includes using toliet, bedpan, or urinal?: A Lot Help from another person bathing (including washing, rinsing, drying)?: A Lot Help from another person to put on and taking off regular upper body  clothing?: A Little Help from another person to put on and taking off regular lower body clothing?: A Lot 6 Click Score: 15   End of Session Equipment Utilized During Treatment: Gait belt;Rolling walker Nurse Communication: Mobility status;Other (comment) (needs laxative)  Activity Tolerance: Patient tolerated treatment well Patient left: in chair;with chair alarm set;with call bell/phone within reach  OT Visit Diagnosis: Unsteadiness on feet (R26.81);Muscle weakness (generalized) (M62.81);Hemiplegia and hemiparesis Hemiplegia - Right/Left: Right Hemiplegia - dominant/non-dominant: Dominant Hemiplegia - caused by: Cerebral infarction                Time: 0950-1010 OT Time Calculation (min): 20 min Charges:  OT General Charges $OT Visit: 1 Visit OT Evaluation $OT Eval Moderate Complexity: 1 Mod  Flora Lipps, OTR/L Acute Rehabilitation Services Pager: [phone] Office: [phone]   Tashayla Therien C 07/03/2020, 2:32 PM

## 2020-07-03 NOTE — Progress Notes (Signed)
PROGRESS NOTE    Jeremy Knapp  KZS:[id number] DOB: August 22, 1950 DOA: 07/02/2020 PCP: System, Provider Not In    Brief Narrative:  Jeremy Knapp was admitted to the hospital with the working diagnosis acute ischemic CVA, left corona radiata and caudate body.   70 year old male with no significant past medical history who presented with acute slurred speech and right-sided weakness.  Ongoing intermittent symptoms for about 48 hours.  He noticed worsening right lower extremity weakness, associated with ambulatory dysfunction, he was dragging his leg, and having poor balance.  Positive slurred speech.  On his initial physical examination blood pressure 140/86, heart rate 82, respiratory rate 13, temperature 97.9, oxygen saturation 96%, his lungs are clear to auscultation bilaterally, heart S1-S2, present, rhythmic, soft abdomen, no lower extremity edema.  Sodium 137, potassium 4.2, chloride 103, bicarb 25, glucose 153, BUN 16, creatinine 1.0, white count 4.4, hemoglobin 15.2, hematocrit 45.4, platelets 192. SARS COVID-19 negative. Urinalysis specific gravity 1.013, negative nitrates. Toxicology negative.  Head CT no acute changes. Head/ neck CT angiography with diminutive right vertebral artery distally occluded.  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  Brain MRI with acute perforated infarct at the left corona radiata and caudate body.   EKG 85 bpm, left axis deviation, left anterior fascicular block, sinus rhythm with poor R wave progression, J-point elevation in V3-V4, no significant T wave changes    Assessment & Plan:   Active Problems:   Acute CVA (cerebrovascular accident) (HCC)   Thyroid nodule greater than or equal to 1.5 cm in diameter incidentally noted on imaging study   CVA (cerebral vascular accident) (HCC)   1. Acute ischemic CVA, left corona radiata and caudate body. Patient continue to have right sided weakness, right arm 2/5 and right leg 3/5  Continue with  dual antiplatelet therapy, blood pressure control and statin. PT/OT and speech therapy.  Plan to transfer to inpatient rehab.   2. Thyroid nodule. Follow as outpatient   3. Tobacco abuse. 7,5 pack years, continue smoking cessation counseling.    Status is: Inpatient  Remains inpatient appropriate because:Inpatient level of care appropriate due to severity of illness   Dispo: The patient is from: Home              Anticipated d/c is to: CIR              Patient currently is medically stable to d/c.   Difficult to place patient No  DVT prophylaxis: Enoxaparin   Code Status:   full  Family Communication:  No family at the bedside      Nutrition Status: Nutrition Problem: Increased nutrient needs Etiology: acute illness Signs/Symptoms: estimated needs Interventions: Ensure Enlive (each supplement provides 350kcal and 20 grams of protein),MVI     Consultants:   Neurology     Subjective: Patient is feeling better but continue to have significant right sided paresis, no nausea or vomiting, no chest pain or dyspnea.   Objective: Vitals:   07/03/20 0013 07/03/20 0455 07/03/20 0754 07/03/20 1230  BP: 132/75 (!) 143/80 (!) 138/91 128/84  Pulse: 73 70 85 79  Resp:  20 18 18   Temp: 98.3 F (36.8 C) 97.9 F (36.6 C) 98.6 F (37 C) 98.2 F (36.8 C)  TempSrc: Oral Oral Oral Oral  SpO2: 96% 98% 98% 97%  Weight:      Height:        Intake/Output Summary (Last 24 hours) at 07/03/2020 1337 Last data filed at 07/02/2020 1505 Gross per  24 hour  Intake 220.72 ml  Output --  Net 220.72 ml   Filed Weights   07/02/20 0307  Weight: 86.2 kg    Examination:   General: Not in pain or dyspnea.  Neurology: Awake and alert, right arm 2/5 right leg 3/5   E ENT: no pallor, no icterus, oral mucosa moist Cardiovascular: No JVD. S1-S2 present, rhythmic, no gallops, rubs, or murmurs. No lower extremity edema. Pulmonary: vesicular breath sounds bilaterally, adequate air movement,  no wheezing, rhonchi or rales. Gastrointestinal. Abdomen soft and non tender Skin. No rashes Musculoskeletal: no joint deformities     Data Reviewed: I have personally reviewed following labs and imaging studies  CBC: Recent Labs  Lab 07/02/20 0315 07/02/20 0324  WBC 4.4  --   NEUTROABS 2.4  --   HGB 15.2 14.6  HCT 45.4 43.0  MCV 94.4  --   PLT 192  --    Basic Metabolic Panel: Recent Labs  Lab 07/02/20 0315 07/02/20 0324  NA 137 139  K 4.2 4.1  CL 103 104  CO2 25  --   GLUCOSE 153* 153*  BUN 16 17  CREATININE 1.02 1.00  CALCIUM 9.6  --    GFR: Estimated Creatinine Clearance: 74.4 mL/min (by C-G formula based on SCr of 1 mg/dL). Liver Function Tests: No results for input(s): AST, ALT, ALKPHOS, BILITOT, PROT, ALBUMIN in the last 168 hours. No results for input(s): LIPASE, AMYLASE in the last 168 hours. No results for input(s): AMMONIA in the last 168 hours. Coagulation Profile: Recent Labs  Lab 07/02/20 0315  INR 1.0   Cardiac Enzymes: No results for input(s): CKTOTAL, CKMB, CKMBINDEX, TROPONINI in the last 168 hours. BNP (last 3 results) No results for input(s): PROBNP in the last 8760 hours. HbA1C: No results for input(s): HGBA1C in the last 72 hours. CBG: Recent Labs  Lab 07/02/20 0305 07/02/20 0340  GLUCAP 150* 164*   Lipid Profile: No results for input(s): CHOL, HDL, LDLCALC, TRIG, CHOLHDL, LDLDIRECT in the last 72 hours. Thyroid Function Tests: Recent Labs    07/02/20 0938  TSH 0.419   Anemia Panel: No results for input(s): VITAMINB12, FOLATE, FERRITIN, TIBC, IRON, RETICCTPCT in the last 72 hours.    Radiology Studies: I have reviewed all of the imaging during this hospital visit personally     Scheduled Meds: . aspirin EC  81 mg Oral Daily  . atorvastatin  40 mg Oral Daily  . clopidogrel  75 mg Oral Q breakfast  . enoxaparin (LOVENOX) injection  40 mg Subcutaneous Q24H  . feeding supplement  237 mL Oral BID BM  . multivitamin  with minerals  1 tablet Oral Daily   Continuous Infusions: . sodium chloride 50 mL/hr at 07/02/20 0941     LOS: 0 days        Cayla Wiegand Annett Gula, MD

## 2020-07-04 DIAGNOSIS — I639 Cerebral infarction, unspecified: Secondary | ICD-10-CM

## 2020-07-04 NOTE — Progress Notes (Signed)
Physical Therapy Treatment Patient Details Name: Jeremy Knapp MRN: [id number] DOB: February 17, 1951 Today's Date: 07/04/2020    History of Present Illness 70 y/o male presented to ED on 4/23 with slurred speech and R sided weakness. Patient witih 2 falls on 4/22 due to weakness. 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 No pertinent PMH.    PT Comments    The pt continues to make great progress with mobility, gait, and coordination of movements this session. He remains highly motivated and was able to complete a series of sit-stands from EOB with minA and HHA provided to steady prior to 3 bouts of ambulation with use of RW and minA that progressed to modA with pt fatigue. The pt was initially able to improve R toe clearance and avoid hyperextension in stance phase, but demos increased challenge maintaining corrections with fatigue. The pt was then given handout of LE exercises to complete outside of therapy session, expressed good understanding. The pt will continue to benefit from skilled PT to progress functional mobility, strength, power, stability, and endurance to facilitate maximal return of independence and mobility. Continue to recommend CIR level therapies.    Follow Up Recommendations  CIR     Equipment Recommendations   (defer to post acute)    Recommendations for Other Services       Precautions / Restrictions Precautions Precautions: Fall Restrictions Weight Bearing Restrictions: No    Mobility  Bed Mobility Overal bed mobility: Modified Independent             General bed mobility comments: pt sitting EOB upon arrival    Transfers Overall transfer level: Needs assistance Equipment used: 1 person hand held assist;Rolling walker (2 wheeled) Transfers: Sit to/from Stand Sit to Stand: Min assist         General transfer comment: minA to steady with increased time to power up from low surface. minA to steady  Ambulation/Gait Ambulation/Gait  assistance: Min assist;Mod assist Gait Distance (Feet): 20 Feet (+ 40 ft + 93ft) Assistive device: Rolling walker (2 wheeled) Gait Pattern/deviations: Step-to pattern;Decreased stride length;Decreased stance time - right;Decreased step length - left;Decreased weight shift to left;Trunk flexed;Wide base of support;Decreased dorsiflexion - right Gait velocity: decreased   General Gait Details: pt with improved advancement of RLE, initially able to maintain soft bend in R knee with stance, but snaps into hyperextension with fatigue. pt able to feel change, verbalize fatigue. hand-over-hand assist to R grip on RW    Modified Rankin (Stroke Patients Only) Modified Rankin (Stroke Patients Only) Pre-Morbid Rankin Score: No symptoms Modified Rankin: Moderately severe disability     Balance Overall balance assessment: Needs assistance Sitting-balance support: No upper extremity supported;Feet supported Sitting balance-Leahy Scale: Fair     Standing balance support: Bilateral upper extremity supported;During functional activity Standing balance-Leahy Scale: Poor Standing balance comment: reliant on UE support and external assist                            Cognition Arousal/Alertness: Awake/alert Behavior During Therapy: WFL for tasks assessed/performed Overall Cognitive Status: Impaired/Different from baseline Area of Impairment: Awareness                           Awareness: Emergent   General Comments: minor cues to attend to RUE, grip, and positioning of RLE, but pt with good adherence to cues and able to sustain cues given  Exercises General Exercises - Lower Extremity Long Arc Quad: AROM;Both;10 reps;Seated (2-3 second hold) Heel Slides: AROM;Right;10 reps;Seated Toe Raises: AROM;Both;10 reps;Seated Heel Raises: AROM;Both;10 reps;Seated    General Comments General comments (skin integrity, edema, etc.): HR to 90s with activity. pt highly motivated,  given HEP printout      Pertinent Vitals/Pain Pain Assessment: No/denies pain           PT Goals (current goals can now be found in the care plan section) Acute Rehab PT Goals Patient Stated Goal: to get back to cycling PT Goal Formulation: With patient Time For Goal Achievement: 07/16/20 Potential to Achieve Goals: Good Progress towards PT goals: Progressing toward goals    Frequency    Min 4X/week      PT Plan Current plan remains appropriate       AM-PAC PT "6 Clicks" Mobility   Outcome Measure  Help needed turning from your back to your side while in a flat bed without using bedrails?: A Little Help needed moving from lying on your back to sitting on the side of a flat bed without using bedrails?: A Little Help needed moving to and from a bed to a chair (including a wheelchair)?: A Little Help needed standing up from a chair using your arms (e.g., wheelchair or bedside chair)?: A Lot Help needed to walk in hospital room?: A Lot Help needed climbing 3-5 steps with a railing? : A Lot 6 Click Score: 15    End of Session Equipment Utilized During Treatment: Gait belt Activity Tolerance: Patient tolerated treatment well Patient left: in bed;with call bell/phone within reach;with bed alarm set (sitting at EOB) Nurse Communication: Mobility status PT Visit Diagnosis: Unsteadiness on feet (R26.81);Muscle weakness (generalized) (M62.81)     Time: 7989-2119 PT Time Calculation (min) (ACUTE ONLY): 34 min  Charges:  $Gait Training: 8-22 mins $Therapeutic Exercise: 8-22 mins                     Rolm Baptise, PT, DPT   Acute Rehabilitation Department Pager #: [phone]   Gaetana Michaelis 07/04/2020, 5:29 PM

## 2020-07-04 NOTE — Progress Notes (Addendum)
PROGRESS NOTE    Jeremy Knapp  RAQ:[id number] DOB: 03/01/1951 DOA: 07/02/2020 PCP: System, Provider Not In    Brief Narrative:  Jeremy Knapp was admitted to the hospital with the working diagnosis acute ischemic CVA, left corona radiata and caudate body.   70 year old male with no significant past medical history who presented with acute slurred speech and right-sided weakness.  Ongoing intermittent symptoms for about 48 hours.  He noticed worsening right lower extremity weakness, associated with ambulatory dysfunction, he was dragging his leg, and having poor balance.  Positive slurred speech.  On his initial physical examination blood pressure 140/86, heart rate 82, respiratory rate 13, temperature 97.9, oxygen saturation 96%, his lungs are clear to auscultation bilaterally, heart S1-S2, present, rhythmic, soft abdomen, no lower extremity edema.  Sodium 137, potassium 4.2, chloride 103, bicarb 25, glucose 153, BUN 16, creatinine 1.0, white count 4.4, hemoglobin 15.2, hematocrit 45.4, platelets 192. SARS COVID-19 negative. Urinalysis specific gravity 1.013, negative nitrates. Toxicology negative.  Head CT no acute changes. Head/ neck CT angiography with diminutive right vertebral artery distally occluded.  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  Brain MRI with acute perforated infarct at the left corona radiata and caudate body.   EKG 85 bpm, left axis deviation, left anterior fascicular block, sinus rhythm with poor R wave progression, J-point elevation in V3-V4, no significant T wave changes   Assessment & Plan:   Active Problems:   Acute CVA (cerebrovascular accident) (HCC)   Thyroid nodule greater than or equal to 1.5 cm in diameter incidentally noted on imaging study   CVA (cerebral vascular accident) (HCC)   1. Acute ischemic CVA, left corona radiata and caudate body. Patient with worsening proximal right upper extremity weakness, persistent distal weakness.  Improved right lower extremity weakness.   Hold on inpatient rehab, until more stable symptoms. Continue with dual antiplatelet therapy and follow up with PT, OT and neurology recommendations.  Neuro checks per unit protocol.    2. Thyroid nodule.  Plan to follow as outpatient   3. Tobacco abuse. 7,5 pack years, Smoking cessation counseling.    Patient continue to be at high risk for worsening neurologic deficit.   Status is: Inpatient  Remains inpatient appropriate because:Inpatient level of care appropriate due to severity of illness   Dispo: The patient is from: Home              Anticipated d/c is to: CIR              Patient currently is not medically stable to d/c.   Difficult to place patient No   DVT prophylaxis: Enoxaparin   Code Status:   full  Family Communication:  No family at the bedside      Nutrition Status: Nutrition Problem: Increased nutrient needs Etiology: acute illness Signs/Symptoms: estimated needs Interventions: Ensure Enlive (each supplement provides 350kcal and 20 grams of protein),MVI      Consultants:   Neurology  Inpatient rehab    Subjective: Patient with worsening right upper extremity proximal weakness, no nausea or vomiting, no chest pain or dyspnea, Improved right lower extremity weakness.   Objective: Vitals:   07/03/20 2000 07/04/20 0030 07/04/20 0337 07/04/20 0828  BP: (!) 141/80 137/82 137/73 128/85  Pulse:   72 75  Resp: 18 18 18 18   Temp: 98.1 F (36.7 C) 97.9 F (36.6 C) 98 F (36.7 C) 97.6 F (36.4 C)  TempSrc: Oral Oral Oral Oral  SpO2: 95% 97% 98% 98%  Weight:      Height:        Intake/Output Summary (Last 24 hours) at 07/04/2020 1301 Last data filed at 07/04/2020 1231 Gross per 24 hour  Intake --  Output 2050 ml  Net -2050 ml   Filed Weights   07/02/20 0307  Weight: 86.2 kg    Examination:   General: Not in pain or dyspnea, deconditioned  Neurology: right upper extremity 2-3/5 proximal,  right lower extremity is 4/5.  E ENT: no pallor, no icterus, oral mucosa moist Cardiovascular: No JVD. S1-S2 present, rhythmic, no gallops, rubs, or murmurs. No lower extremity edema. Pulmonary: vesicular breath sounds bilaterally, adequate air movement, no wheezing, rhonchi or rales. Gastrointestinal. Abdomen soft and non tender Skin. No rashes Musculoskeletal: no joint deformities     Data Reviewed: I have personally reviewed following labs and imaging studies  CBC: Recent Labs  Lab 07/02/20 0315 07/02/20 0324  WBC 4.4  --   NEUTROABS 2.4  --   HGB 15.2 14.6  HCT 45.4 43.0  MCV 94.4  --   PLT 192  --    Basic Metabolic Panel: Recent Labs  Lab 07/02/20 0315 07/02/20 0324  NA 137 139  K 4.2 4.1  CL 103 104  CO2 25  --   GLUCOSE 153* 153*  BUN 16 17  CREATININE 1.02 1.00  CALCIUM 9.6  --    GFR: Estimated Creatinine Clearance: 74.4 mL/min (by C-G formula based on SCr of 1 mg/dL). Liver Function Tests: No results for input(s): AST, ALT, ALKPHOS, BILITOT, PROT, ALBUMIN in the last 168 hours. No results for input(s): LIPASE, AMYLASE in the last 168 hours. No results for input(s): AMMONIA in the last 168 hours. Coagulation Profile: Recent Labs  Lab 07/02/20 0315  INR 1.0   Cardiac Enzymes: No results for input(s): CKTOTAL, CKMB, CKMBINDEX, TROPONINI in the last 168 hours. BNP (last 3 results) No results for input(s): PROBNP in the last 8760 hours. HbA1C: No results for input(s): HGBA1C in the last 72 hours. CBG: Recent Labs  Lab 07/02/20 0305 07/02/20 0340  GLUCAP 150* 164*   Lipid Profile: No results for input(s): CHOL, HDL, LDLCALC, TRIG, CHOLHDL, LDLDIRECT in the last 72 hours. Thyroid Function Tests: Recent Labs    07/02/20 0938  TSH 0.419   Anemia Panel: No results for input(s): VITAMINB12, FOLATE, FERRITIN, TIBC, IRON, RETICCTPCT in the last 72 hours.    Radiology Studies: I have reviewed all of the imaging during this hospital visit  personally     Scheduled Meds: . aspirin EC  81 mg Oral Daily  . atorvastatin  40 mg Oral Daily  . clopidogrel  75 mg Oral Q breakfast  . enoxaparin (LOVENOX) injection  40 mg Subcutaneous Q24H  . feeding supplement  237 mL Oral BID BM  . multivitamin with minerals  1 tablet Oral Daily   Continuous Infusions:   LOS: 1 day        Serenidy Waltz Annett Gula, MD

## 2020-07-04 NOTE — Plan of Care (Signed)
  Problem: Education: Goal: Knowledge of General Education information will improve Description: Including pain rating scale, medication(s)/side effects and non-pharmacologic comfort measures Outcome: Progressing   Problem: Health Behavior/Discharge Planning: Goal: Ability to manage health-related needs will improve Outcome: Progressing   Problem: Clinical Measurements: Goal: Ability to maintain clinical measurements within normal limits will improve Outcome: Progressing Goal: Will remain free from infection Outcome: Progressing Goal: Diagnostic test results will improve Outcome: Progressing Goal: Respiratory complications will improve Outcome: Progressing Goal: Cardiovascular complication will be avoided Outcome: Progressing   Problem: Activity: Goal: Risk for activity intolerance will decrease Outcome: Progressing   Problem: Nutrition: Goal: Adequate nutrition will be maintained Outcome: Progressing   Problem: Coping: Goal: Level of anxiety will decrease Outcome: Progressing   Problem: Elimination: Goal: Will not experience complications related to bowel motility Outcome: Progressing Goal: Will not experience complications related to urinary retention Outcome: Progressing   Problem: Pain Managment: Goal: General experience of comfort will improve Outcome: Progressing   Problem: Safety: Goal: Ability to remain free from injury will improve Outcome: Progressing   Problem: Skin Integrity: Goal: Risk for impaired skin integrity will decrease Outcome: Progressing   Problem: Education: Goal: Knowledge of disease or condition will improve Outcome: Progressing Goal: Knowledge of secondary prevention will improve Outcome: Progressing Goal: Knowledge of patient specific risk factors addressed and post discharge goals established will improve Outcome: Progressing Goal: Individualized Educational Video(s) Outcome: Progressing   Problem: Health Behavior/Discharge  Planning: Goal: Ability to manage health-related needs will improve Outcome: Progressing   Problem: Self-Care: Goal: Ability to participate in self-care as condition permits will improve Outcome: Progressing   Problem: Ischemic Stroke/TIA Tissue Perfusion: Goal: Complications of ischemic stroke/TIA will be minimized Outcome: Progressing   

## 2020-07-04 NOTE — Progress Notes (Signed)
Inpatient Rehab Admissions Coordinator:  Able to speak with pt's niece, Adriane on the telephone. Explained CIR goals and expectations. She acknowledged understanding. She is interested in pt pursuing CIR.  Will continue to follow.   Wolfgang Phoenix, MS, CCC-SLP Admissions Coordinator [phone]

## 2020-07-04 NOTE — PMR Pre-admission (Shared)
PMR Admission Coordinator Pre-Admission Assessment  Patient: Jeremy Knapp is an 70 y.o., male MRN: [id number] DOB: [birth year] Height: 6' (182.9 cm) Weight: 86.2 kg              Insurance Information HMO: ***    PPO: ***     PCP:      IPA:      80/20:      OTHER:  PRIMARYRolene Arbour: [employer] Medicare      Policy#: [id number]      Subscriber: patient CM Name: ***      Phone#: ***     Fax#: *** Pre-Cert#: ***      Employer: *** Benefits:  Phone #: ***     Name: *** Dolores HooseEff. Date: ***     Deduct: ***      Out of Pocket Max: ***      Life Max: ***  CIR: ***      SNF: *** Outpatient: ***     Co-Pay: *** Home Health: ***      Co-Pay: *** DME: ***     Co-Pay: *** Providers: in-network SECONDARY: Medicaid out of state      Policy#: [id number]      Phone#: [phone]  Financial Counselor:       Phone#:   The "Data Collection Information Summary" for patients in Inpatient Rehabilitation Facilities with attached "Privacy Act Statement-Health Care Records" was provided and verbally reviewed with: {CHL IP Patient Family [id number]}[id number]}  Emergency Contact Information Contact Information    Name Relation Home Work [name]   [name], Jeremy Knapp Niece   [phone]     Current Medical History  Patient Admitting Diagnosis: Left corona radiata and caudate body infarct with right hemiparesis  History of Present Illness: Pt is a 70 y.o. right-handed male with history of tobacco use on no prescription medications.  Presented 07/02/2020 with acute onset of right-sided weakness and slurred speech as well as dizziness with fall x2.  Per chart review [place].  He was in the [place] area visiting his brother and sister.  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  CT angiogram head and neck diminutive right vertebral artery which it was occluded distally.  Patent left vertebral artery with mild V4 segment stenosis.  Mild right  supraclinoid ICA and left M1 stenosis.  Patient did not receive tPA.  Echocardiogram with ejection fraction of 50 to 55% grade 1 diastolic dysfunction.  Admission chemistries unremarkable except glucose 153, urine drug screen negative.  Currently maintained on aspirin as well as Plavix for CVA prophylaxis.  Subcutaneous Lovenox for DVT prophylaxis.  Tolerating a regular diet.  Therapy evaluations completed due to patient's right side weakness and slurred speech recommendations of physical medicine rehab consult.  Complete NIHSS TOTAL: 2    Past Medical History  History reviewed. No pertinent past medical history.  Family History  family history includes Dementia in his sister.  Prior Rehab/Hospitalizations:  Has the patient had prior rehab or hospitalizations prior to admission? No  Has the patient had major surgery during 100 days prior to admission? No  Current Medications   Current Facility-Administered Medications:  .  acetaminophen (TYLENOL) tablet 650 mg, 650 mg, Oral, Q4H PRN **OR** [DISCONTINUED] acetaminophen (TYLENOL) 160 MG/5ML solution 650 mg, 650 mg, Per Tube, Q4H PRN **OR** [DISCONTINUED] acetaminophen (TYLENOL) suppository 650 mg, 650 mg, Rectal, Q4H PRN, Jonah BlueYates, Jennifer, MD .  aspirin EC tablet 81  mg, 81 mg, Oral, Daily, Jonah Blue, MD, 81 mg at 07/04/20 0819 .  atorvastatin (LIPITOR) tablet 40 mg, 40 mg, Oral, Daily, Jonah Blue, MD, 40 mg at 07/04/20 0819 .  clopidogrel (PLAVIX) tablet 75 mg, 75 mg, Oral, Q breakfast, [place], Kofi, MD, 75 mg at 07/04/20 0819 .  enoxaparin (LOVENOX) injection 40 mg, 40 mg, Subcutaneous, Q24H, Jonah Blue, MD, 40 mg at 07/03/20 1206 .  feeding supplement (ENSURE ENLIVE / ENSURE PLUS) liquid 237 mL, 237 mL, Oral, BID BM, Jonah Blue, MD, 237 mL at 07/03/20 1430 .  multivitamin with minerals tablet 1 tablet, 1 tablet, Oral, Daily, Jonah Blue, MD, 1 tablet at 07/04/20 [phone] .  senna-docusate (Senokot-S) tablet 1 tablet, 1  tablet, Oral, QHS PRN, Jonah Blue, MD, 1 tablet at 07/03/20 1309  Patients Current Diet:  Diet Order            Diet Heart Room service appropriate? 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  Independent  Indoor Mobility: Did the patient need assistance with walking from room to room (with or without device)? Independent  Stairs: Did the patient need assistance with internal or external stairs (with or without device)? Independent  Functional Cognition: Did the patient need help planning regular tasks such as shopping or remembering to take medications? Independent  Home Assistive Devices / Equipment Home Equipment: None  Prior Device Use: Indicate devices/aids used by the patient prior to current illness, exacerbation or injury? None of the above  Current Functional Level Cognition  Arousal/Alertness: Awake/alert Overall Cognitive Status: Impaired/Different from baseline Orientation Level: Oriented X4 General Comments: Pt slightly frustrated with diagnosis wanting to know why and how he can prevent another CVA. Attention: Selective Selective Attention: Appears intact Memory: Impaired Memory Impairment: Other (comment),Retrieval deficit (recalled 3/5 words after 2 minute delay, recalled 3/4 delayed recall questions based on short story) Awareness: Appears intact Problem Solving: Appears intact Safety/Judgment: Appears intact    Extremity  Assessment (includes Sensation/Coordination)  Upper Extremity Assessment: Generalized weakness,RUE deficits/detail RUE Deficits / Details: strength 2-/5 MM grade, fair grip strength; assist to place on RW RUE Coordination: decreased fine motor,decreased gross motor  Lower Extremity Assessment: Defer to PT evaluation,Generalized weakness,RLE deficits/detail RLE Deficits / Details: weakness    ADLs  Overall ADL's : Needs assistance/impaired Eating/Feeding: Set up,Sitting Eating/Feeding Details (indicate cue type and reason): pt given built up handle for self feeding for RUE; assist required to bring fork to mouth due to weakness and poor coordination Grooming: Minimal assistance,Standing Grooming Details (indicate cue type and reason): leaning on counter, increased time to open.close containers Upper Body Bathing: Minimal assistance,Sitting Lower Body Bathing: Moderate assistance,Sitting/lateral leans,Sit to/from stand Upper Body Dressing : Minimal assistance,Sitting,Standing Lower Body Dressing: Moderate assistance,Cueing for safety Toilet Transfer: Minimal assistance,+2 for physical assistance,+2 for safety/equipment,Ambulation,RW Toilet Transfer Details (indicate cue type and reason): pt requiring cues to avoid obstacles and to keep RLE from dragging Toileting- Clothing Manipulation and Hygiene: Moderate  assistance,Sitting/lateral lean,Sit to/from stand Toileting - Clothing Manipulation Details (indicate cue type and reason): pt sat on commode, but unable to haev BM- pt wanting laxative. Functional mobility during ADLs: Minimal assistance,Rolling walker,Cueing for safety,Cueing for sequencing General ADL Comments: Pt limited by decreased strength, coordination and decreased ability to care for self with RUE weakness. Pt dragging RLE at times and cues to stay in RW for mobility in room.    Mobility  Overal bed mobility: Needs Assistance Bed Mobility: Supine to Sit Supine to sit:  Supervision,HOB elevated General bed mobility comments: supervision for safety    Transfers  Overall transfer level: Needs assistance Equipment used: None,Rolling walker (2 wheeled) Transfers: Sit to/from Stand Sit to Stand: Min assist General transfer comment: MinA with RW in front; minA for stability with standing. Cues for hand placement    Ambulation / Gait / Stairs / Wheelchair Mobility  Ambulation/Gait Ambulation/Gait assistance: Min assist,Mod assist Gait Distance (Feet): 15 Feet Assistive device: Rolling walker (2 wheeled) Gait Pattern/deviations: Step-to pattern,Decreased stride length,Decreased stance time - right,Decreased step length - left,Decreased weight shift to left,Trunk flexed,Wide base of support General Gait Details: Tends to drag R foot behind due to weakness. Cues for increasing step length. MinA for straight path but modA for turns and close quarters. Gait velocity: decreased    Posture / Balance Balance Overall balance assessment: Needs assistance Sitting-balance support: No upper extremity supported,Feet supported Sitting balance-Leahy Scale: Fair Standing balance support: Bilateral upper extremity supported,During functional activity Standing balance-Leahy Scale: Poor Standing balance comment: reliant on UE support and external assist    Special needs/care consideration Designated visitor Darrol Poke, niece, External Urinary Catheter     Previous Home Environment (from acute therapy documentation) Living Arrangements: Alone (From [place], sister and brother lives here in [place]) Available Help at Discharge: Family,Available PRN/intermittently Type of Home: Apartment Home Layout: One level (apartment is in the basement) Home Access: Stairs to enter Entrance Stairs-Rails: Left Entrance Stairs-Number of Steps: 10 Bathroom Shower/Tub: Engineer, manufacturing systems: Standard Bathroom Accessibility: Yes How Accessible: Accessible via walker Home Care  Services: No Additional Comments: Pt lives and works in [place], [place], but was visiting sister via 10 hour train ride.  Discharge Living Setting Plans for Discharge Living Setting: House Type of Home at Discharge: House Discharge Home Layout: One level Discharge Home Access: Stairs to enter Entrance Stairs-Rails: Left Entrance Stairs-Number of Steps: 4 Discharge Bathroom Shower/Tub: Tub/shower unit Discharge Bathroom Toilet: Handicapped height Discharge Bathroom Accessibility: Yes How Accessible: Accessible via walker Does the patient have any problems obtaining your medications?: No  Social/Family/Support Systems Anticipated Caregiver: Darrol Poke, niece Anticipated Caregiver's Contact Information: [phone] Ability/Limitations of Caregiver: able to provide supervision but not physical help d/t already taking care of mother with dementia Discharge Plan Discussed with Primary Caregiver: Yes Is Caregiver In Agreement with Plan?: Yes Does Caregiver/Family have Issues with Lodging/Transportation while Pt is in Rehab?: No   Goals Patient/Family Goal for Rehab: Mod I: PT/OT Expected length of stay: 7-10 days Pt/Family Agrees to Admission and willing to participate: Yes Program Orientation Provided & Reviewed with Pt/Caregiver Including Roles  & Responsibilities: Yes   Decrease burden of Care through IP rehab admission: NA   Possible need for SNF placement upon discharge:Not anticipated   Patient Condition: This patient's condition remains as documented in the consult dated 07/04/20, in which the Rehabilitation Physician determined and documented that the patient's condition is appropriate for intensive rehabilitative care in an inpatient rehabilitation facility. Will admit to inpatient rehab  07/06/20.  Preadmission Screen Completed By:  Domingo Pulse, CCC-SLP, 07/04/2020 3:56 PM ______________________________________________________________________   Discussed status with  Dr. Marland Kitchenon***at *** and received approval for admission today.  Admission Coordinator:  Domingo Pulse, time***/Date***

## 2020-07-04 NOTE — NC FL2 (Signed)
  [place] MEDICAID FL2 LEVEL OF CARE SCREENING TOOL     IDENTIFICATION  Patient Name: Jeremy Knapp Birthdate: [birth year] Sex: male Admission Date (Current Location): 07/02/2020  [facility] and [place] Number:  Producer, television/film/video and Address:  [facility], [address] [place], [place] [address]      Provider Number: [id number]  Attending Physician Name and Address:  Coralie Keens  Relative Name and Phone Number:       Current Level of Care: Hospital Recommended Level of Care: Skilled Nursing Facility Prior Approval Number:    Date Approved/Denied:   PASRR Number:    Discharge Plan: SNF    Current Diagnoses: Patient Active Problem List   Diagnosis Date Noted  . CVA (cerebral vascular accident) (HCC) 07/03/2020  . Acute CVA (cerebrovascular accident) (HCC) 07/02/2020  . Thyroid nodule greater than or equal to 1.5 cm in diameter incidentally noted on imaging study 07/02/2020    Orientation RESPIRATION BLADDER Height & Weight     Self,Time,Situation,Place  Normal Continent Weight: 86.2 kg Height:  6' (182.9 cm)  BEHAVIORAL SYMPTOMS/MOOD NEUROLOGICAL BOWEL NUTRITION STATUS      Continent Diet (heart healthy with thin liquids)  AMBULATORY STATUS COMMUNICATION OF NEEDS Skin   Limited Assist Verbally Normal                       Personal Care Assistance Level of Assistance  Bathing,Feeding,Dressing Bathing Assistance: Limited assistance Feeding assistance: Independent Dressing Assistance: Limited assistance     Functional Limitations Info  Sight,Hearing,Speech Sight Info: Impaired Hearing Info: Adequate Speech Info: Adequate    SPECIAL CARE FACTORS FREQUENCY  PT (By licensed PT),OT (By licensed OT),Speech therapy     PT Frequency: 5x/wk OT Frequency: 5x/wk     Speech Therapy Frequency: 5x/wk      Contractures Contractures Info: Not present    Additional Factors Info  Code Status,Allergies Code Status  Info: Full Allergies Info: NKA           Current Medications (07/04/2020):  This is the current hospital active medication list Current Facility-Administered Medications  Medication Dose Route Frequency Provider Last Rate Last Admin  . acetaminophen (TYLENOL) tablet 650 mg  650 mg Oral Q4H PRN Jonah Blue, MD      . aspirin EC tablet 81 mg  81 mg Oral Daily Jonah Blue, MD   81 mg at 07/04/20 0819  . atorvastatin (LIPITOR) tablet 40 mg  40 mg Oral Daily Jonah Blue, MD   40 mg at 07/04/20 0819  . clopidogrel (PLAVIX) tablet 75 mg  75 mg Oral Q breakfast Beryle Beams, MD   75 mg at 07/04/20 0819  . enoxaparin (LOVENOX) injection 40 mg  40 mg Subcutaneous Q24H Jonah Blue, MD   40 mg at 07/04/20 1621  . feeding supplement (ENSURE ENLIVE / ENSURE PLUS) liquid 237 mL  237 mL Oral BID BM Jonah Blue, MD   237 mL at 07/03/20 1430  . multivitamin with minerals tablet 1 tablet  1 tablet Oral Daily Jonah Blue, MD   1 tablet at 07/04/20 [phone]  . senna-docusate (Senokot-S) tablet 1 tablet  1 tablet Oral QHS PRN Jonah Blue, MD   1 tablet at 07/03/20 1309     Discharge Medications: Please see discharge summary for a list of discharge medications.  Relevant Imaging Results:  Relevant Lab Results:   Additional Information SS#: [id number]  Kermit Balo, RN

## 2020-07-05 ENCOUNTER — Inpatient Hospital Stay (HOSPITAL_COMMUNITY): Payer: Medicare (Managed Care)

## 2020-07-05 MED ORDER — BISACODYL 5 MG PO TBEC
10.0000 mg | DELAYED_RELEASE_TABLET | Freq: Once | ORAL | Status: AC
  Administered 2020-07-05: 10 mg via ORAL
  Filled 2020-07-05: qty 2

## 2020-07-05 MED ORDER — POLYETHYLENE GLYCOL 3350 17 G PO PACK
17.0000 g | PACK | Freq: Two times a day (BID) | ORAL | Status: DC
  Administered 2020-07-05 – 2020-07-08 (×6): 17 g via ORAL
  Filled 2020-07-05 (×7): qty 1

## 2020-07-05 MED ORDER — SODIUM CHLORIDE 0.9 % IV SOLN: INTRAVENOUS | Status: DC

## 2020-07-05 NOTE — Plan of Care (Signed)
  Problem: Education: Goal: Knowledge of General Education information will improve Description: Including pain rating scale, medication(s)/side effects and non-pharmacologic comfort measures Outcome: Progressing   Problem: Health Behavior/Discharge Planning: Goal: Ability to manage health-related needs will improve Outcome: Progressing   Problem: Clinical Measurements: Goal: Ability to maintain clinical measurements within normal limits will improve Outcome: Progressing Goal: Will remain free from infection Outcome: Progressing Goal: Diagnostic test results will improve Outcome: Progressing Goal: Respiratory complications will improve Outcome: Progressing Goal: Cardiovascular complication will be avoided Outcome: Progressing   Problem: Activity: Goal: Risk for activity intolerance will decrease Outcome: Progressing   Problem: Nutrition: Goal: Adequate nutrition will be maintained Outcome: Progressing   Problem: Coping: Goal: Level of anxiety will decrease Outcome: Progressing   Problem: Elimination: Goal: Will not experience complications related to bowel motility Outcome: Progressing Goal: Will not experience complications related to urinary retention Outcome: Progressing   Problem: Pain Managment: Goal: General experience of comfort will improve Outcome: Progressing   Problem: Safety: Goal: Ability to remain free from injury will improve Outcome: Progressing   Problem: Skin Integrity: Goal: Risk for impaired skin integrity will decrease Outcome: Progressing   Problem: Education: Goal: Knowledge of disease or condition will improve Outcome: Progressing Goal: Knowledge of secondary prevention will improve Outcome: Progressing Goal: Knowledge of patient specific risk factors addressed and post discharge goals established will improve Outcome: Progressing Goal: Individualized Educational Video(s) Outcome: Progressing   Problem: Health Behavior/Discharge  Planning: Goal: Ability to manage health-related needs will improve Outcome: Progressing   Problem: Self-Care: Goal: Ability to participate in self-care as condition permits will improve Outcome: Progressing   Problem: Ischemic Stroke/TIA Tissue Perfusion: Goal: Complications of ischemic stroke/TIA will be minimized Outcome: Progressing   

## 2020-07-05 NOTE — Progress Notes (Signed)
Case discussed with Dr. Pearlean Brownie from stroke service, will plan to order a follow-up head CT, start intravenous fluids and have bedrest for 24 hours. Continue close neurologic monitoring.

## 2020-07-05 NOTE — TOC Initial Note (Signed)
Transition of Care [facility]) - Initial/Assessment Note    Patient Details  Name: Jeremy Knapp MRN: [id number] Date of Birth: 25-May-1950  Transition of Care [facility]) CM/SW Contact:    Kermit Balo, RN Phone Number: 07/05/2020, 1:20 PM  Clinical Narrative:                 Patient lives at home with his siblings and parents. Pt admits noncompliance with seizure meds but plans on doing better in the future.  Pt has PCP through [facility]: Arlan Organ PA and states he has a neurologist through [facility] also.  TOC following.  Expected Discharge Plan: Home/Self Care Barriers to Discharge: Continued Medical Work up   Patient Goals and CMS Choice        Expected Discharge Plan and Services Expected Discharge Plan: Home/Self Care   Discharge Planning Services: CM Consult   Living arrangements for the past 2 months: Single Family Home                                      Prior Living Arrangements/Services Living arrangements for the past 2 months: Single Family Home Lives with:: Parents Patient language and need for interpreter reviewed:: Yes Do you feel safe going back to the place where you live?: Yes            Criminal Activity/Legal Involvement Pertinent to Current Situation/Hospitalization: No - Comment as needed  Activities of Daily Living   ADL Screening (condition at time of admission) Patient's cognitive ability adequate to safely complete daily activities?: Yes Is the patient deaf or have difficulty hearing?: No Does the patient have difficulty seeing, even when wearing glasses/contacts?: No Does the patient have difficulty concentrating, remembering, or making decisions?: No Does the patient have difficulty dressing or bathing?: Yes Does the patient have difficulty walking or climbing stairs?: Yes  Permission Sought/Granted                  Emotional Assessment Appearance:: Appears stated age Attitude/Demeanor/Rapport: Engaged Affect (typically  observed): Accepting Orientation: : Oriented to Self,Oriented to Place,Oriented to  Time,Oriented to Situation   Psych Involvement: No (comment)  Admission diagnosis:  TIA (transient ischemic attack) [G45.9] Stroke-like symptom [R29.90] Fall, initial encounter [W19.XXXA] CVA (cerebral vascular accident) [facility]) [I63.9] Patient Active Problem List   Diagnosis Date Noted  . CVA (cerebral vascular accident) (HCC) 07/03/2020  . Acute CVA (cerebrovascular accident) (HCC) 07/02/2020  . Thyroid nodule greater than or equal to 1.5 cm in diameter incidentally noted on imaging study 07/02/2020   PCP:  System, Provider Not In Pharmacy:   [id number] - [place], [place] - [address] AT [facility] [address] [place] [address] Phone: [phone] Fax: [phone]     Social Determinants of Health (SDOH) Interventions    Readmission Risk Interventions No flowsheet data found.

## 2020-07-05 NOTE — Progress Notes (Signed)
Inpatient Rehab Admissions Coordinator:   Spoke to the pt's niece at length on the phone to discuss CIR goals/expectations.  I was able to observe his therapy session this morning, and his RUE is certainly weaker (Dr. Ella Jubilee aware and already consulting neuro on this).  I do believe that he would still be able to reach a mod I level, and I explained to Adriane what that would look like.  She is currently the sole caregiver of her mother, who has advanced dementia, and is concerned that if pt does not reach a completely independent level she will be unable to care for him and her mother.  She would like to pursue SNF at this time.  TOC aware.  Will sign off for CIR, but pt would benefit from f/u in PM&R clinic as an outpatient.    Estill Dooms, PT, DPT Admissions Coordinator [phone] 07/05/20  2:53 PM

## 2020-07-05 NOTE — Progress Notes (Addendum)
PROGRESS NOTE    Jeremy Knapp  FYB:[id number] DOB: [birth year] DOA: 07/02/2020 PCP: System, Provider Not In    Brief Narrative:  Jeremy Knapp admitted to the hospital with the working diagnosis acute ischemic CVA, left corona radiata and caudate body.  70 year old male with no significant past medical history who presented with acute slurred speech and right-sided weakness. Ongoing intermittent symptoms for about 48 hours.He noticed worsening right lower extremity weakness, associated with ambulatory dysfunction, he was dragging his leg, and having poor balance.Positive slurred speech. On his initial physical examination blood pressure 140/86, heart rate 82, respiratory rate 13, temperature 97.9, oxygen saturation 96%, his lungs are clear to auscultation bilaterally, heart S1-S2, present, rhythmic, soft abdomen, no lower extremity edema.  Sodium 137, potassium 4.2, chloride 103, bicarb 25, glucose 153, BUN 16, creatinine 1.0, white count 4.4, hemoglobin 15.2, hematocrit 45.4, platelets 192. SARS COVID-19 negative. Urinalysis specific gravity 1.013, negative nitrates. Toxicology negative.  Head CT no acute changes. Head/ neckCT angiography with diminutive right vertebral artery distally occluded. 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  Brain MRI with acute perforated infarct at the left corona radiata andcaudate body.  EKG 85 bpm, left axis deviation, left anterior fascicular block, sinus rhythm with poor R wave progression, J-point elevation in V3-V4, no significant T wave changes    Assessment & Plan:   Active Problems:   Acute CVA (cerebrovascular accident) (HCC)   Thyroid nodule greater than or equal to 1.5 cm in diameter incidentally noted on imaging study   CVA (cerebral vascular accident) (HCC)   1. Acute ischemic CVA, left corona radiata and caudate body. Patient persistent  proximal and distal right upper extremity weakness, right leg with  improvement in strength and mobility.    Plan to continue with dual antiplatelet therapy for 3 weeks then continue with aspirin alone (81 mg). Statin therapy with atorvastatin.   Pending re-evaluation in order to be transferred to inpatient rehab.   2. Thyroid nodule.  Outpatient follow up.   3. Tobacco abuse. 7,5 pack years, Continue with smoking cessation counseling  4. Constipation. Add dulcolax x 1 dose and start with bid miralax. Increase mobility,    Status is: Inpatient  Remains inpatient appropriate because:Unsafe d/c plan   Dispo: The patient is from: Home              Anticipated d/c is to: CIR              Patient currently is not medically stable to d/c.   Difficult to place patient No   DVT prophylaxis: Enoxaparin   Code Status:   full  Family Communication:  No family at the bedside      Nutrition Status: Nutrition Problem: Increased nutrient needs Etiology: acute illness Signs/Symptoms: estimated needs Interventions: Ensure Enlive (each supplement provides 350kcal and 20 grams of protein),MVI    Consultants:   Neurology   Inpatient rehab    Subjective: Patient continue to have right upper extremity paresis, proximal and distal, no nausea or vomiting, no chest pain or dyspnea, continue to have constipation.   Objective: Vitals:   07/04/20 1955 07/04/20 2300 07/05/20 0335 07/05/20 0700  BP: (!) 141/89 (!) 140/91 129/82 134/74  Pulse: 83 80 81 71  Resp: 19 18 18 18   Temp: 97.7 F (36.5 C) 98 F (36.7 C) 98.6 F (37 C) 98.2 F (36.8 C)  TempSrc: Oral Oral Oral Oral  SpO2: 99% 98% 100% 99%  Weight:      Height:  Intake/Output Summary (Last 24 hours) at 07/05/2020 1048 Last data filed at 07/04/2020 1949 Gross per 24 hour  Intake --  Output 1400 ml  Net -1400 ml   Filed Weights   07/02/20 0307  Weight: 86.2 kg    Examination:   General: Not in pain or dyspnea  Neurology: Awake and alert, positive paresis right upper  extremity.   E ENT: no pallor, no icterus, oral mucosa moist Cardiovascular: No JVD. S1-S2 present, rhythmic, no gallops, rubs, or murmurs. No lower extremity edema. Pulmonary: positive breath sounds bilaterally, adequate air movement, no wheezing, rhonchi or rales. Gastrointestinal. Abdomen soft and non tender Skin. No rashes Musculoskeletal: no joint deformities     Data Reviewed: I have personally reviewed following labs and imaging studies  CBC: Recent Labs  Lab 07/02/20 0315 07/02/20 0324  WBC 4.4  --   NEUTROABS 2.4  --   HGB 15.2 14.6  HCT 45.4 43.0  MCV 94.4  --   PLT 192  --    Basic Metabolic Panel: Recent Labs  Lab 07/02/20 0315 07/02/20 0324  NA 137 139  K 4.2 4.1  CL 103 104  CO2 25  --   GLUCOSE 153* 153*  BUN 16 17  CREATININE 1.02 1.00  CALCIUM 9.6  --    GFR: Estimated Creatinine Clearance: 74.4 mL/min (by C-G formula based on SCr of 1 mg/dL). Liver Function Tests: No results for input(s): AST, ALT, ALKPHOS, BILITOT, PROT, ALBUMIN in the last 168 hours. No results for input(s): LIPASE, AMYLASE in the last 168 hours. No results for input(s): AMMONIA in the last 168 hours. Coagulation Profile: Recent Labs  Lab 07/02/20 0315  INR 1.0   Cardiac Enzymes: No results for input(s): CKTOTAL, CKMB, CKMBINDEX, TROPONINI in the last 168 hours. BNP (last 3 results) No results for input(s): PROBNP in the last 8760 hours. HbA1C: No results for input(s): HGBA1C in the last 72 hours. CBG: Recent Labs  Lab 07/02/20 0305 07/02/20 0340  GLUCAP 150* 164*   Lipid Profile: No results for input(s): CHOL, HDL, LDLCALC, TRIG, CHOLHDL, LDLDIRECT in the last 72 hours. Thyroid Function Tests: No results for input(s): TSH, T4TOTAL, FREET4, T3FREE, THYROIDAB in the last 72 hours. Anemia Panel: No results for input(s): VITAMINB12, FOLATE, FERRITIN, TIBC, IRON, RETICCTPCT in the last 72 hours.    Radiology Studies: I have reviewed all of the imaging during  this hospital visit personally     Scheduled Meds: . aspirin EC  81 mg Oral Daily  . atorvastatin  40 mg Oral Daily  . clopidogrel  75 mg Oral Q breakfast  . enoxaparin (LOVENOX) injection  40 mg Subcutaneous Q24H  . feeding supplement  237 mL Oral BID BM  . multivitamin with minerals  1 tablet Oral Daily   Continuous Infusions:   LOS: 2 days        Shontez Sermon Annett Gula, MD

## 2020-07-05 NOTE — Progress Notes (Signed)
Physical Therapy Treatment Patient Details Name: Jeremy Knapp MRN: [id number] DOB: 1950-08-27 Today's Date: 07/05/2020    History of Present Illness 70 y/o male presented to ED on 4/23 with slurred speech and R sided weakness. Patient witih 2 falls on 4/22 due to weakness. 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 No pertinent PMH.    PT Comments    Pt remains very motivated to progress with therapy, frustrated by further weakness R hand. Worked on sit>stand without assist, pt unable to achieve full standing without min A. Needed vc's for proper R foot placement, min A for R hand on RW, and min A to steady. Pt ambulated 71' with RW and min A, tending to keep B knees flexed and flexing trunk to avoid R knee hyperextension. Worked last 20' on more erect posture. Standing balance activities performed with perturbation with min A as well as standing RLE stabilization exercises. Pt reports RLE fatigue end of session. Continue to recommend CIR for pt to reach independence and be able to d/c home with family.     Follow Up Recommendations  CIR     Equipment Recommendations   (defer to post acute)    Recommendations for Other Services Rehab consult     Precautions / Restrictions Precautions Precautions: Fall Restrictions Weight Bearing Restrictions: No    Mobility  Bed Mobility Overal bed mobility: Modified Independent Bed Mobility: Sit to Supine       Sit to supine: Modified independent (Device/Increase time)   General bed mobility comments: pt getting in and out of bed independently    Transfers Overall transfer level: Needs assistance Equipment used: Rolling walker (2 wheeled) Transfers: Sit to/from Stand Sit to Stand: Min assist         General transfer comment: pt attempted sit>stand several times without physical assist but unable to achieve without min A each time. Needed vc's for R foot placement as well as physical assist to keep R hand in position  on RW. Would benefit from hand cup R on RW  Ambulation/Gait Ambulation/Gait assistance: Min assist Gait Distance (Feet): 70 Feet Assistive device: Rolling walker (2 wheeled) Gait Pattern/deviations: Step-to pattern;Decreased stride length;Decreased stance time - right;Decreased step length - left;Trunk flexed;Wide base of support;Decreased dorsiflexion - right;Decreased weight shift to right Gait velocity: decreased Gait velocity interpretation: <1.8 ft/sec, indicate of risk for recurrent falls General Gait Details: pt able to ambulate without R knee hyperextension but sometimes keeping B knees in flexed position and trunk flexed to prevent hyperextension. No LOB with straight ambulation on level surface but with turning pt comes close to a cross over step and needs min A to maintain balance with tight turns.   Stairs             Wheelchair Mobility    Modified Rankin (Stroke Patients Only) Modified Rankin (Stroke Patients Only) Pre-Morbid Rankin Score: No symptoms Modified Rankin: Moderately severe disability     Balance Overall balance assessment: Needs assistance Sitting-balance support: No upper extremity supported;Feet supported Sitting balance-Leahy Scale: Good Sitting balance - Comments: increased time to make corrections but can maintain static sitting without assist   Standing balance support: Bilateral upper extremity supported;During functional activity Standing balance-Leahy Scale: Poor Standing balance comment: reliant on UE support and external assist. Worked on unsupported standing balance with perturbation, pt required min A with eyes open and eyes closed               High Level Balance Comments: worked  on tapping L foot 12" to side and fwd, 10x each for stabilization with RLE            Cognition Arousal/Alertness: Awake/alert Behavior During Therapy: WFL for tasks assessed/performed Overall Cognitive Status: Impaired/Different from  baseline Area of Impairment: Awareness                           Awareness: Emergent   General Comments: pt showing good mindfulness of tending to RUE, continues to have perception deficits R side, getting very close to R sided obstacles      Exercises      General Comments General comments (skin integrity, edema, etc.): VSS. Pt fatigued after session, especially RLE. Discussed continuing to use RUE as much as possible.      Pertinent Vitals/Pain Pain Assessment: No/denies pain    Home Living                      Prior Function            PT Goals (current goals can now be found in the care plan section) Acute Rehab PT Goals Patient Stated Goal: to get back to cycling PT Goal Formulation: With patient Time For Goal Achievement: 07/16/20 Potential to Achieve Goals: Good Progress towards PT goals: Progressing toward goals    Frequency    Min 4X/week      PT Plan Current plan remains appropriate    Co-evaluation              AM-PAC PT "6 Clicks" Mobility   Outcome Measure  Help needed turning from your back to your side while in a flat bed without using bedrails?: A Little Help needed moving from lying on your back to sitting on the side of a flat bed without using bedrails?: A Little Help needed moving to and from a bed to a chair (including a wheelchair)?: A Little Help needed standing up from a chair using your arms (e.g., wheelchair or bedside chair)?: A Lot Help needed to walk in hospital room?: A Lot Help needed climbing 3-5 steps with a railing? : A Lot 6 Click Score: 15    End of Session Equipment Utilized During Treatment: Gait belt Activity Tolerance: Patient tolerated treatment well Patient left: in bed;with call bell/phone within reach (sitting at EOB) Nurse Communication: Mobility status PT Visit Diagnosis: Unsteadiness on feet (R26.81);Muscle weakness (generalized) (M62.81)     Time: 6761-9509 PT Time Calculation  (min) (ACUTE ONLY): 26 min  Charges:  $Gait Training: 8-22 mins $Therapeutic Activity: 8-22 mins                     Lyanne Co, PT  Acute Rehab Services  Pager [phone] Office [phone]    Lawana Chambers Jakyle Petrucelli 07/05/2020, 2:14 PM

## 2020-07-05 NOTE — TOC Progression Note (Signed)
Transition of Care [facility]) - Progression Note    Patient Details  Name: Kairee Isa MRN: [id number] Date of Birth: August 20, 1950  [facility]) CM/SW Contact  Kermit Balo, RN Phone Number: 07/05/2020, 4:19 PM  Clinical Narrative:    CIR unable to offer d/c lack of needed support at d/c. Niece and pt agreeable to SNF rehab. CM has sent out messages to several SNF's to see if able to offer on the patient, then will need insurance auth.  TOC following.   Expected Discharge Plan: Home/Self Care Barriers to Discharge: Continued Medical Work up  Expected Discharge Plan and Services Expected Discharge Plan: Home/Self Care   Discharge Planning Services: CM Consult   Living arrangements for the past 2 months: Single Family Home                                       Social Determinants of Health (SDOH) Interventions    Readmission Risk Interventions No flowsheet data found.

## 2020-07-06 NOTE — Progress Notes (Signed)
PROGRESS NOTE  Jeremy Knapp WUJ:[id number] DOB: 09-Jul-1950 DOA: 07/02/2020 PCP: System, Provider Not In   LOS: 3 days   Brief Narrative / Interim history: 70 year old male with no significant past medical history came into the hospital with acute slurred speech and right-sided weakness.  Symptoms were ongoing for past 48 hours.  He was having poor balance and dragging his right leg as well as slurred speech.  An MRI of the brain in the ED showed acute perforated infarct in the left corona radiata and caudate body.  On 4/26 his right arm became weaker and had repeat CT scan of the head  Subjective / 24h Interval events: He is doing well this morning, his right arm is weaker than on admission but about the same as yesterday.  Eating well  Assessment & Plan: Principal Problem Acute ischemic CVA, left corona radiata and caudate body -patient with persistent proximal and distal right upper extremity weakness, right leg.  Neurology consulted and followed patient while hospitalized.  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  Obtain lipid panel and A1c.  2D echo showed EF 50-55%, grade 1 diastolic dysfunction.  RV is normal.  Neurology recommending dual antiplatelet therapy for 3 weeks then continue with aspirin alone.  He is also on a statin.  Insurance authorization is pending for SNF  Active Problems  Thyroid nodule-Outpatient follow up.   TSH normal  Tobacco use -Counseled for cessation    Scheduled Meds: . aspirin EC  81 mg Oral Daily  . atorvastatin  40 mg Oral Daily  . clopidogrel  75 mg Oral Q breakfast  . enoxaparin (LOVENOX) injection  40 mg Subcutaneous Q24H  . feeding supplement  237 mL Oral BID BM  . multivitamin with minerals  1 tablet Oral Daily  . polyethylene glycol  17 g Oral BID   Continuous Infusions: PRN  Meds:.acetaminophen **OR** [DISCONTINUED] acetaminophen (TYLENOL) oral liquid 160 mg/5 mL **OR** [DISCONTINUED] acetaminophen, senna-docusate  Diet Orders (From admission, onward)    Start     Ordered   07/02/20 0921  Diet Heart Room service appropriate? Yes; Fluid consistency: Thin  Diet effective ____       Comments: Effective once patient has passed the swallow evaluation  Question Answer Comment  Room service appropriate? Yes   Fluid consistency: Thin      07/02/20 0923          DVT prophylaxis: enoxaparin (LOVENOX) injection 40 mg Start: 07/02/20 1200     Code Status: Full Code  Family Communication: no family at bedside   Status is: Inpatient  Remains inpatient appropriate because:Inpatient level of care appropriate due to severity of illness   Dispo: The patient is from: Home              Anticipated d/c is to: SNF              Patient currently is medically stable to d/c.   Difficult to place patient No   Level of care: Telemetry Medical  Consultants:  Neurology   Procedures:  2D echo  Microbiology  none  Antimicrobials: none    Objective: Vitals:   07/05/20 2050 07/06/20 0017 07/06/20 0328 07/06/20 0700  BP: (!) 145/84 129/77 122/78 130/83  Pulse: 89 76 72 75  Resp: 18 17 15 18   Temp: 98.9 F (37.2 C) 97.9 F (36.6 C) 98.5 F (  36.9 C) 98 F (36.7 C)  TempSrc: Oral Oral  Oral  SpO2: 100% 95% 96% 94%  Weight:      Height:        Intake/Output Summary (Last 24 hours) at 07/06/2020 0954 Last data filed at 07/06/2020 0800 Gross per 24 hour  Intake --  Output 1250 ml  Net -1250 ml   Filed Weights   07/02/20 0307  Weight: 86.2 kg    Examination:  Constitutional: NAD Eyes: no scleral icterus ENMT: Mucous membranes are moist.  Neck: normal, supple Respiratory: clear to auscultation bilaterally, no wheezing, no crackles. Normal respiratory effort. No accessory muscle use.  Cardiovascular: Regular rate and rhythm, no murmurs / rubs /  gallops. No LE edema.  Abdomen: non distended, no tenderness. Bowel sounds positive.  Musculoskeletal: no clubbing / cyanosis.  Skin: no rashes Neurologic: CN 2-12 grossly intact.  Strength 1/5 right upper extremity, 4/5 right lower extremity. Psychiatric: Normal judgment and insight. Alert and oriented x 3. Normal mood.    Data Reviewed: I have independently reviewed following labs and imaging studies   CBC: Recent Labs  Lab 07/02/20 0315 07/02/20 0324  WBC 4.4  --   NEUTROABS 2.4  --   HGB 15.2 14.6  HCT 45.4 43.0  MCV 94.4  --   PLT 192  --    Basic Metabolic Panel: Recent Labs  Lab 07/02/20 0315 07/02/20 0324  NA 137 139  K 4.2 4.1  CL 103 104  CO2 25  --   GLUCOSE 153* 153*  BUN 16 17  CREATININE 1.02 1.00  CALCIUM 9.6  --    Liver Function Tests: No results for input(s): AST, ALT, ALKPHOS, BILITOT, PROT, ALBUMIN in the last 168 hours. Coagulation Profile: Recent Labs  Lab 07/02/20 0315  INR 1.0   HbA1C: No results for input(s): HGBA1C in the last 72 hours. CBG: Recent Labs  Lab 07/02/20 0305 07/02/20 0340  GLUCAP 150* 164*    Recent Results (from the past 240 hour(s))  Resp Panel by RT-PCR (Flu A&B, Covid) Nasopharyngeal Swab     Status: None   Collection Time: 07/02/20  5:30 AM   Specimen: Nasopharyngeal Swab; Nasopharyngeal(NP) swabs in vial transport medium  Result Value Ref Range Status   SARS Coronavirus 2 by RT PCR NEGATIVE NEGATIVE Final    Comment: (NOTE) SARS-CoV-2 target nucleic acids are NOT DETECTED.  The SARS-CoV-2 RNA is generally detectable in upper respiratory specimens during the acute phase of infection. The lowest concentration of SARS-CoV-2 viral copies this assay can detect is 138 copies/mL. A negative result does not preclude SARS-Cov-2 infection and should not be used as the sole basis for treatment or other patient management decisions. A negative result may occur with  improper specimen collection/handling, submission  of specimen other than nasopharyngeal swab, presence of viral mutation(s) within the areas targeted by this assay, and inadequate number of viral copies(<138 copies/mL). A negative result must be combined with clinical observations, patient history, and epidemiological information. The expected result is Negative.  Fact Sheet for Patients:  BloggerCourse.com  Fact Sheet for Healthcare Providers:  SeriousBroker.it  This test is no t yet approved or cleared by the [place] FDA and  has been authorized for detection and/or diagnosis of SARS-CoV-2 by FDA under an Emergency Use Authorization (EUA). This EUA will remain  in effect (meaning this test can be used) for the duration of the COVID-19 declaration under Section 564(b)(1) of the Act, 21 U.S.C.section 360bbb-3(b)(1), unless the authorization  is terminated  or revoked sooner.       Influenza A by PCR NEGATIVE NEGATIVE Final   Influenza B by PCR NEGATIVE NEGATIVE Final    Comment: (NOTE) The Xpert Xpress SARS-CoV-2/FLU/RSV plus assay is intended as an aid in the diagnosis of influenza from Nasopharyngeal swab specimens and should not be used as a sole basis for treatment. Nasal washings and aspirates are unacceptable for Xpert Xpress SARS-CoV-2/FLU/RSV testing.  Fact Sheet for Patients: BloggerCourse.com  Fact Sheet for Healthcare Providers: SeriousBroker.it  This test is not yet approved or cleared by the [place] FDA and has been authorized for detection and/or diagnosis of SARS-CoV-2 by FDA under an Emergency Use Authorization (EUA). This EUA will remain in effect (meaning this test can be used) for the duration of the COVID-19 declaration under Section 564(b)(1) of the Act, 21 U.S.C. section 360bbb-3(b)(1), unless the authorization is terminated or revoked.  Performed at [facility], [address],  [place], [place] [address]      Radiology Studies: CT HEAD WO CONTRAST  Result Date: 07/05/2020 CLINICAL DATA:  Follow-up examination for acute stroke, worsening deficit. EXAM: CT HEAD WITHOUT CONTRAST TECHNIQUE: Contiguous axial images were obtained from the base of the skull through the vertex without intravenous contrast. COMPARISON:  Prior MRI from 07/02/2020 FINDINGS: Brain: Continued interval evolution of recently identified infarct involving the left basal ganglia/corona radiata, stable in size and distribution as compared to recent MRI. No hemorrhagic transformation, significant regional mass effect, or other complication. No other visible acute large vessel territory infarct. No other intracranial hemorrhage. No mass lesion, mass effect, or midline shift. No hydrocephalus or extra-axial fluid collection. Vascular: No hyperdense vessel. Skull: Scalp soft tissues and calvarium within normal limits. Sinuses/Orbits: Globes and orbital soft tissues demonstrate no acute finding. Paranasal sinuses and mastoid air cells are largely clear. Other: None. IMPRESSION: 1. Continued interval evolution of recently identified infarct involving the left basal ganglia/corona radiata, stable in size and distribution as compared to recent MRI. No hemorrhagic transformation, significant regional mass effect, or other complication. 2. No other new acute intracranial abnormality. Electronically Signed   By: Rise Mu M.D.   On: 07/05/2020 22:13    Pamella Pert, MD, PhD [facility]  Between 7 am - 7 pm I am available, please contact me via Amion (for emergencies) or Securechat (non urgent messages)  Between 7 pm - 7 am I am not available, please contact night coverage MD/APP via Amion

## 2020-07-06 NOTE — Care Management Important Message (Signed)
Important Message  Patient Details  Name: Jeremy Knapp MRN: [id number] Date of Birth: 1950-09-19   Medicare Important Message Given:  Yes     [place] Gambone Stefan Church 07/06/2020, 3:48 PM

## 2020-07-06 NOTE — Progress Notes (Signed)
Physical Therapy Treatment Patient Details Name: Jeremy Knapp MRN: [id number] DOB: Oct 16, 1950 Today's Date: 07/06/2020    History of Present Illness Pt is a 70 y.o. male admitted 07/02/20 with slurred speech and R-side weakness, pt also with 2x recent falls due to weakness the day before. 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 No pertinent PMH.   PT Comments    Pt progressing with mobility, extremely motivated to participate and regain PLOF. Today's session focused on transfer and gait training with RW; pt requires assist for stability, RUE/RLE positioning, and frequent verbal cues for sequencing and safe technique. Pt with decreased awareness of R-side extremities with increased fatigue. Noted CIR declined secondary to lack of family support upon d/c, therefore recommend SNF-level therapies to maximize functional mobility and independence prior to return home, even though pt would be a great candidate for intensive CIR-level therapies. Will continue to follow acutely.    Follow Up Recommendations  CIR;Supervision for mobility/OOB (CIR declined, will need SNF)     Equipment Recommendations   (defer)    Recommendations for Other Services       Precautions / Restrictions Precautions Precautions: Fall Restrictions Weight Bearing Restrictions: No    Mobility  Bed Mobility               General bed mobility comments: Received sitting EOB    Transfers Overall transfer level: Needs assistance Equipment used: Rolling walker (2 wheeled) Transfers: Sit to/from Stand Sit to Stand: Min assist         General transfer comment: Performed multiple sit<>stands from EOB and recliner to RW, pt requiring minA for trunk elevation/stability and initial verbal cues for RUE/RLE placement and sequencing to stand. Practiced "mental checklist" prior to standing, including attending to 1) RUE, 2) RLE, and 3) pushing to stand with LUE (as opposed to pulling on RW), pt  with good carryover with intermittent verbal cues as reminders. Poor eccentric control into sitting requiring verbal cues for sequencing/technique. Intermittent minA to aid hand placement on RW  Ambulation/Gait Ambulation/Gait assistance: Min assist;Mod assist Gait Distance (Feet): 70 Feet Assistive device: Rolling walker (2 wheeled) Gait Pattern/deviations: Step-to pattern;Decreased stride length;Decreased stance time - right;Decreased step length - left;Trunk flexed;Wide base of support;Decreased dorsiflexion - right;Decreased weight shift to right Gait velocity: Decreased   General Gait Details: Slow, intermittently unsteady gait with RW; verbal cues to maintain upright posture and to attend to R hand when losing grip on RW handle; pt with increased R foot drag and R hand slipping off RW with increased fatigue, requiring min-modA for stability and R hand hold on walker. Cues to pause and reset posture/RUE support more frequently, pt reuiuring increased verbal cues with fatigue   Stairs             Wheelchair Mobility    Modified Rankin (Stroke Patients Only) Modified Rankin (Stroke Patients Only) Pre-Morbid Rankin Score: No symptoms Modified Rankin: Moderately severe disability     Balance Overall balance assessment: Needs assistance Sitting-balance support: No upper extremity supported;Feet supported Sitting balance-Leahy Scale: Good     Standing balance support: Bilateral upper extremity supported;During functional activity;Single extremity supported Standing balance-Leahy Scale: Poor Standing balance comment: Reliant on UE support. Pt taking LUE off RW to reach outside BOS requiring max cues for safety that even though RUE placed on RW, it could easily slip resulting in LOB  Cognition Arousal/Alertness: Awake/alert Behavior During Therapy: WFL for tasks assessed/performed Overall Cognitive Status: Impaired/Different from  baseline Area of Impairment: Problem solving                           Awareness: Emergent Problem Solving: Requires verbal cues General Comments: Intermittent cues for carryover of tasks; demonstrates mindfulness of tending to RUE/RLE, requiring increased cues with fatigue      Exercises Other Exercises Other Exercises: 5x repeated sit<>stand from recliner to RW (with "checklist" prior to standing - RUE/RLE placement and LUE pushing to stand)    General Comments General comments (skin integrity, edema, etc.): Fatigued RUE/RLE at end of session, but pt motivated and appreciative      Pertinent Vitals/Pain Pain Assessment: No/denies pain    Home Living                      Prior Function            PT Goals (current goals can now be found in the care plan section) Progress towards PT goals: Progressing toward goals    Frequency    Min 4X/week      PT Plan Current plan remains appropriate    Co-evaluation              AM-PAC PT "6 Clicks" Mobility   Outcome Measure  Help needed turning from your back to your side while in a flat bed without using bedrails?: A Little Help needed moving from lying on your back to sitting on the side of a flat bed without using bedrails?: A Little Help needed moving to and from a bed to a chair (including a wheelchair)?: A Little Help needed standing up from a chair using your arms (e.g., wheelchair or bedside chair)?: A Lot Help needed to walk in hospital room?: A Lot Help needed climbing 3-5 steps with a railing? : A Lot 6 Click Score: 15    End of Session Equipment Utilized During Treatment: Gait belt Activity Tolerance: Patient tolerated treatment well Patient left: in chair;with call bell/phone within reach;with chair alarm set Nurse Communication: Mobility status PT Visit Diagnosis: Unsteadiness on feet (R26.81);Muscle weakness (generalized) (M62.81)     Time: 6979-4801 PT Time Calculation (min)  (ACUTE ONLY): 24 min  Charges:  $Gait Training: 8-22 mins $Therapeutic Activity: 8-22 mins                     Ina Homes, PT, DPT Acute Rehabilitation Services  Pager [phone] Office [phone]  Malachy Chamber 07/06/2020, 11:30 AM

## 2020-07-06 NOTE — Progress Notes (Signed)
Occupational Therapy Treatment Patient Details Name: Jeremy Knapp MRN: [id number] DOB: 01-14-1951 Today's Date: 07/06/2020    History of present illness Pt is a 70 y.o. male admitted 07/02/20 with slurred speech and R-side weakness, pt also with 2x recent falls due to weakness the day before. 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 No pertinent PMH.   OT comments  Pt making steady progress towards OT goals this session. Session focus on RUE therex from recliner. Pt discouraged by lack of progression with RUE although pt able to complete therex as indicated below with no reports of increased pain. Pt able to complete active assist ROM with R shoulder, and pt does have some active elbow flexion/ extension ( would benefit from completing therex in a gravity eliminated environment), pt able to grasp<>release squeeze ball as well. Encouraged pt to continue HEP throughout day. Noted CIR signed off, therefore updated DC recs to SNF. Will continue to follow acutely per POC.    Follow Up Recommendations  SNF;Other (comment) (CIR denied)    Equipment Recommendations  None recommended by OT    Recommendations for Other Services      Precautions / Restrictions Precautions Precautions: Fall Restrictions Weight Bearing Restrictions: No       Mobility Bed Mobility               General bed mobility comments: OOB in recliner and left seated in recliner at end of session    Transfers                      Balance                                           ADL either performed or assessed with clinical judgement   ADL Overall ADL's : Needs assistance/impaired                                       General ADL Comments: pt recieved in recliner session focus on seated RUE therex to increase strength and AROM for higher level BADLS     Vision       Perception     Praxis      Cognition Arousal/Alertness:  Awake/alert Behavior During Therapy: WFL for tasks assessed/performed Overall Cognitive Status: Impaired/Different from baseline Area of Impairment: Problem solving;Attention                   Current Attention Level: Focused         Problem Solving: Requires verbal cues;Requires tactile cues General Comments: verbal cues and tactile cues needed at times to follow commands related to therex, focused on not progressing fast enough with RUE        Exercises General Exercises - Upper Extremity Shoulder Flexion: Self ROM;Right;10 reps;Seated Elbow Flexion: Self ROM;Right;10 reps;Seated Elbow Extension: Self ROM;Right;10 reps;Seated Other Exercises Other Exercises: forward flexion/ extension, horizontal ADD/ ABD of shoulder with LUE on top of RUE ( following outline of tray table) with towel underneath hands to decrease friction Other Exercises: composite flexion/ extension of digits with grasp and release of squeeze ball   Shoulder Instructions       General Comments      Pertinent Vitals/ Pain       Pain  Assessment: No/denies pain  Home Living                                          Prior Functioning/Environment              Frequency  Min 2X/week        Progress Toward Goals  OT Goals(current goals can now be found in the care plan section)  Progress towards OT goals: Progressing toward goals  Acute Rehab OT Goals Patient Stated Goal: to get his arm moving more OT Goal Formulation: With patient Time For Goal Achievement: 07/17/20 Potential to Achieve Goals: Good  Plan Discharge plan remains appropriate;Frequency remains appropriate    Co-evaluation                 AM-PAC OT "6 Clicks" Daily Activity     Outcome Measure   Help from another person eating meals?: A Little Help from another person taking care of personal grooming?: A Little Help from another person toileting, which includes using toliet, bedpan, or  urinal?: A Lot Help from another person bathing (including washing, rinsing, drying)?: A Lot Help from another person to put on and taking off regular upper body clothing?: A Lot Help from another person to put on and taking off regular lower body clothing?: A Lot 6 Click Score: 14    End of Session Equipment Utilized During Treatment: Other (comment) (squeeze ball)  OT Visit Diagnosis: Unsteadiness on feet (R26.81);Muscle weakness (generalized) (M62.81);Hemiplegia and hemiparesis Hemiplegia - Right/Left: Right Hemiplegia - dominant/non-dominant: Dominant Hemiplegia - caused by: Cerebral infarction   Activity Tolerance Patient tolerated treatment well   Patient Left in chair;with chair alarm set;with call bell/phone within reach   Nurse Communication Mobility status        Time: 1140-1155 OT Time Calculation (min): 15 min  Charges: OT General Charges $OT Visit: 1 Visit OT Treatments $Therapeutic Exercise: 8-22 mins  Lenor Derrick., COTA/L Acute Rehabilitation Services [phone]    Barron Schmid 07/06/2020, 4:16 PM

## 2020-07-07 LAB — SARS CORONAVIRUS 2 (TAT 6-24 HRS): SARS Coronavirus 2: NEGATIVE

## 2020-07-07 NOTE — Progress Notes (Signed)
Pt refused labs, continued to refuse after teaching the importance and reasoning the labs were intended to be taken.   Patient said "they took them when I got here and the day after that. That's enough."

## 2020-07-07 NOTE — Progress Notes (Signed)
Physical Therapy Treatment Patient Details Name: Jeremy Knapp MRN: [id number] DOB: 1950/09/28 Today's Date: 07/07/2020    History of Present Illness Pt is a 70 y.o. male admitted 07/02/20 with slurred speech and R-side weakness, pt also with 2x recent falls due to weakness the day before. 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 No pertinent PMH.   PT Comments    Pt continues to progress well with mobility. Today's session focused on continued transfer and gait training with RW. Pt demonstrates improved ability to engage RLE with gait progression, still requires frequent cues to manage and engage RUE during session. Pt remains motivated to participate and regain PLOF. Noted CIR declined secondary to lack of family support upon d/c, therefore recommend SNF-level therapies to maximize functional mobility and independence prior to return home, even though pt would be a great candidate for intensive CIR-level therapies. Will continue to follow acutely.   Follow Up Recommendations  CIR;Supervision for mobility/OOB (CIR declined, will need SNF)     Equipment Recommendations   (defer)    Recommendations for Other Services       Precautions / Restrictions Precautions Precautions: Fall Restrictions Weight Bearing Restrictions: No    Mobility  Bed Mobility Overal bed mobility: Needs Assistance Bed Mobility: Supine to Sit     Supine to sit: Supervision     General bed mobility comments: use of bed rail, supervision for safety    Transfers Overall transfer level: Needs assistance Equipment used: Rolling walker (2 wheeled) Transfers: Sit to/from Stand Sit to Stand: Min assist         General transfer comment: Performed multiple sit<>stands from EOB and recliner to RW; pt requires intermittent minA for trunk elevation/stability and verbal cues for RUE/RLE placement and sequencing to stand. Practiced "mental checklist" prior to standing, including attending  to 1) RUE, 2) RLE, and 3) pushing to stand with LUE (as opposed to pulling on RW), pt with good carryover with intermittent verbal cues as reminders. Poor eccentric control into sitting requiring verbal cues for sequencing/technique. Pt using compensatory shoulder/trunk muscles to aid RUE placement on RW, cues and demonstration to use R elbow/wrist/fingers as much as possible  Ambulation/Gait Ambulation/Gait assistance: Min assist;Mod assist Gait Distance (Feet): 60 Feet (+30+20) Assistive device: Rolling walker (2 wheeled) Gait Pattern/deviations: Step-to pattern;Step-through pattern;Decreased stride length;Decreased dorsiflexion - right;Decreased weight shift to right Gait velocity: Decreased   General Gait Details: Slow, mildly unsteady gait with RW. Pt with improved attention to and use of RLE, intermittent verbal cues to take bigger step in order to clear R foot; increased R foot drag and circumduction-type gait with fatigue; pt requires verbal cues to stop and readjust RUE support on RW, requiring min-modA for RUE support at times, especially with fatigue   Stairs             Wheelchair Mobility    Modified Rankin (Stroke Patients Only) Modified Rankin (Stroke Patients Only) Pre-Morbid Rankin Score: No symptoms Modified Rankin: Moderately severe disability     Balance Overall balance assessment: Needs assistance Sitting-balance support: No upper extremity supported;Feet supported Sitting balance-Leahy Scale: Good     Standing balance support: Bilateral upper extremity supported;During functional activity;Single extremity supported Standing balance-Leahy Scale: Poor Standing balance comment: Reliant on UE support. pt continues to take LUE off RW to reach for objects, requires verbal cues for safety that even though RUE placed on RW, it could easily slip resulting in LOB  Cognition Arousal/Alertness: Awake/alert Behavior During  Therapy: WFL for tasks assessed/performed Overall Cognitive Status: Impaired/Different from baseline Area of Impairment: Attention;Memory;Problem solving                   Current Attention Level: Sustained;Selective Memory: Decreased short-term memory     Awareness: Emergent Problem Solving: Requires verbal cues;Requires tactile cues General Comments: Pt does not recall "checklist" from yesterday's session we practiced multiple times with sit<>stand, recalled with verbal cues. Requires frequent verbal/tactile cues to R-side      Exercises      General Comments General comments (skin integrity, edema, etc.): pt asking about additional grip strength training devices besides squeeze ball, pt not interested in putty, showed him a few pictures/links of other options      Pertinent Vitals/Pain Pain Assessment: No/denies pain    Home Living                      Prior Function            PT Goals (current goals can now be found in the care plan section) Progress towards PT goals: Progressing toward goals    Frequency    Min 4X/week      PT Plan Current plan remains appropriate    Co-evaluation              AM-PAC PT "6 Clicks" Mobility   Outcome Measure  Help needed turning from your back to your side while in a flat bed without using bedrails?: A Little Help needed moving from lying on your back to sitting on the side of a flat bed without using bedrails?: A Little Help needed moving to and from a bed to a chair (including a wheelchair)?: A Little Help needed standing up from a chair using your arms (e.g., wheelchair or bedside chair)?: A Lot Help needed to walk in hospital room?: A Lot Help needed climbing 3-5 steps with a railing? : A Lot 6 Click Score: 15    End of Session Equipment Utilized During Treatment: Gait belt Activity Tolerance: Patient tolerated treatment well Patient left: in chair;with call bell/phone within reach;with chair  alarm set Nurse Communication: Mobility status PT Visit Diagnosis: Unsteadiness on feet (R26.81);Muscle weakness (generalized) (M62.81)     Time: 3244-0102 PT Time Calculation (min) (ACUTE ONLY): 21 min  Charges:  $Gait Training: 8-22 mins                     Ina Homes, PT, DPT Acute Rehabilitation Services  Pager [phone] Office [phone]  Malachy Chamber 07/07/2020, 12:42 PM

## 2020-07-07 NOTE — Progress Notes (Signed)
PROGRESS NOTE  Jeremy Knapp VOZ:[id number] DOB: 1950-06-04 DOA: 07/02/2020 PCP: System, Provider Not In   LOS: 4 days   Brief Narrative / Interim history: 70 year old male with no significant past medical history came into the hospital with acute slurred speech and right-sided weakness.  Symptoms were ongoing for past 48 hours.  He was having poor balance and dragging his right leg as well as slurred speech.  An MRI of the brain in the ED showed acute perforated infarct in the left corona radiata and caudate body.  On 4/26 his right arm became weaker and had repeat CT scan of the head  Subjective / 24h Interval events: No complaints.   Waiting for SNF, per TOC should be tomorrow  Assessment & Plan: Principal Problem Acute ischemic CVA, left corona radiata and caudate body -patient with persistent proximal and distal right upper extremity weakness, right leg.  Neurology consulted and followed patient while hospitalized.  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  Obtain lipid panel and A1c.  2D echo showed EF 50-55%, grade 1 diastolic dysfunction.  RV is normal.  Neurology recommending dual antiplatelet therapy for 3 weeks then continue with aspirin alone.  He is also on a statin.  Insurance authorization is pending for SNF  Active Problems  Thyroid nodule-Outpatient follow up.   TSH normal  Tobacco use -Counseled for cessation    Scheduled Meds: . aspirin EC  81 mg Oral Daily  . atorvastatin  40 mg Oral Daily  . clopidogrel  75 mg Oral Q breakfast  . enoxaparin (LOVENOX) injection  40 mg Subcutaneous Q24H  . feeding supplement  237 mL Oral BID BM  . multivitamin with minerals  1 tablet Oral Daily  . polyethylene glycol  17 g Oral BID   Continuous Infusions: PRN Meds:.acetaminophen **OR** [DISCONTINUED] acetaminophen (TYLENOL) oral  liquid 160 mg/5 mL **OR** [DISCONTINUED] acetaminophen, senna-docusate  Diet Orders (From admission, onward)    Start     Ordered   07/02/20 0921  Diet Heart Room service appropriate? Yes; Fluid consistency: Thin  Diet effective ____       Comments: Effective once patient has passed the swallow evaluation  Question Answer Comment  Room service appropriate? Yes   Fluid consistency: Thin      07/02/20 0923          DVT prophylaxis: enoxaparin (LOVENOX) injection 40 mg Start: 07/02/20 1200     Code Status: Full Code  Family Communication: no family at bedside   Status is: Inpatient  Remains inpatient appropriate because:Inpatient level of care appropriate due to severity of illness   Dispo: The patient is from: Home              Anticipated d/c is to: SNF              Patient currently is medically stable to d/c.   Difficult to place patient No   Level of care: Telemetry Medical  Consultants:  Neurology   Procedures:  2D echo  Microbiology  none  Antimicrobials: none    Objective: Vitals:   07/06/20 2332 07/07/20 0338 07/07/20 0733 07/07/20 1212  BP: 133/84 126/81 129/78 129/83  Pulse: 83 73 74 91  Resp: 19 17 20 20   Temp: 98.3 F (36.8 C) 97.8 F (36.6 C) 98.8 F (37.1 C) 97.8 F (36.6 C)  TempSrc: Oral Oral Oral Oral  SpO2: 96% 97% 98% 100%  Weight:      Height:        Intake/Output Summary (Last 24 hours) at 07/07/2020 1358 Last data filed at 07/07/2020 0800 Gross per 24 hour  Intake --  Output 1345 ml  Net -1345 ml   Filed Weights   07/02/20 0307  Weight: 86.2 kg    Examination:  Constitutional: NAD Respiratory: CTA biL, no wheezing Cardiovascular: RRR, no MRG  Data Reviewed: I have independently reviewed following labs and imaging studies   CBC: Recent Labs  Lab 07/02/20 0315 07/02/20 0324  WBC 4.4  --   NEUTROABS 2.4  --   HGB 15.2 14.6  HCT 45.4 43.0  MCV 94.4  --   PLT 192  --    Basic Metabolic Panel: Recent Labs   Lab 07/02/20 0315 07/02/20 0324  NA 137 139  K 4.2 4.1  CL 103 104  CO2 25  --   GLUCOSE 153* 153*  BUN 16 17  CREATININE 1.02 1.00  CALCIUM 9.6  --    Liver Function Tests: No results for input(s): AST, ALT, ALKPHOS, BILITOT, PROT, ALBUMIN in the last 168 hours. Coagulation Profile: Recent Labs  Lab 07/02/20 0315  INR 1.0   HbA1C: No results for input(s): HGBA1C in the last 72 hours. CBG: Recent Labs  Lab 07/02/20 0305 07/02/20 0340  GLUCAP 150* 164*    Recent Results (from the past 240 hour(s))  Resp Panel by RT-PCR (Flu A&B, Covid) Nasopharyngeal Swab     Status: None   Collection Time: 07/02/20  5:30 AM   Specimen: Nasopharyngeal Swab; Nasopharyngeal(NP) swabs in vial transport medium  Result Value Ref Range Status   SARS Coronavirus 2 by RT PCR NEGATIVE NEGATIVE Final    Comment: (NOTE) SARS-CoV-2 target nucleic acids are NOT DETECTED.  The SARS-CoV-2 RNA is generally detectable in upper respiratory specimens during the acute phase of infection. The lowest concentration of SARS-CoV-2 viral copies this assay can detect is 138 copies/mL. A negative result does not preclude SARS-Cov-2 infection and should not be used as the sole basis for treatment or other patient management decisions. A negative result may occur with  improper specimen collection/handling, submission of specimen other than nasopharyngeal swab, presence of viral mutation(s) within the areas targeted by this assay, and inadequate number of viral copies(<138 copies/mL). A negative result must be combined with clinical observations, patient history, and epidemiological information. The expected result is Negative.  Fact Sheet for Patients:  BloggerCourse.com  Fact Sheet for Healthcare Providers:  SeriousBroker.it  This test is no t yet approved or cleared by the [place] FDA and  has been authorized for detection and/or diagnosis of  SARS-CoV-2 by FDA under an Emergency Use Authorization (EUA). This EUA will remain  in effect (meaning this test can be used) for the duration of the COVID-19 declaration under Section 564(b)(1) of the Act, 21 U.S.C.section 360bbb-3(b)(1), unless the authorization is terminated  or revoked sooner.       Influenza A by PCR NEGATIVE NEGATIVE Final   Influenza B by PCR NEGATIVE NEGATIVE Final    Comment: (NOTE) The Xpert Xpress SARS-CoV-2/FLU/RSV plus assay is intended as an aid in the diagnosis of influenza from Nasopharyngeal swab specimens and should not be used as a sole basis for treatment. Nasal washings and aspirates are unacceptable for Xpert Xpress SARS-CoV-2/FLU/RSV testing.  Fact Sheet for Patients: BloggerCourse.com  Fact Sheet for Healthcare Providers: SeriousBroker.it  This test is not yet approved or  cleared by the [place] and has been authorized for detection and/or diagnosis of SARS-CoV-2 by FDA under an Emergency Use Authorization (EUA). This EUA will remain in effect (meaning this test can be used) for the duration of the COVID-19 declaration under Section 564(b)(1) of the Act, 21 U.S.C. section 360bbb-3(b)(1), unless the authorization is terminated or revoked.  Performed at [facility], [address], [place], [place] [address]      Radiology Studies: No results found.  Pamella Pert, MD, PhD [facility]  Between 7 am - 7 pm I am available, please contact me via Amion (for emergencies) or Securechat (non urgent messages)  Between 7 pm - 7 am I am not available, please contact night coverage MD/APP via Amion

## 2020-07-08 MED ORDER — CLOPIDOGREL BISULFATE 75 MG PO TABS: 75.0000 mg | ORAL_TABLET | Freq: Every day | ORAL | Status: AC

## 2020-07-08 MED ORDER — ASPIRIN 81 MG PO TBEC: 81.0000 mg | DELAYED_RELEASE_TABLET | Freq: Every day | ORAL | 11 refills | Status: AC

## 2020-07-08 MED ORDER — ATORVASTATIN CALCIUM 40 MG PO TABS: 40.0000 mg | ORAL_TABLET | Freq: Every day | ORAL | Status: AC

## 2020-07-08 NOTE — TOC Transition Note (Signed)
Transition of Care [facility]) - CM/SW Discharge Note   Patient Details  Name: Jeremy Knapp MRN: [id number] Date of Birth: 1950-08-17  Transition of Care [facility]) CM/SW Contact:  Kermit Balo, RN Phone Number: 07/08/2020, 9:12 AM   Clinical Narrative:    Patient is discharging to [facility] today.  [facility] providing transport. Bedside RN updated and d/c packet at the desk.   Room: 3238 Number for report: [phone]   Final next level of care: Skilled Nursing Facility Barriers to Discharge: No Barriers Identified   Patient Goals and CMS Choice   CMS Medicare.gov Compare Post Acute Care list provided to:: Patient Choice offered to / list presented to : Patient (niece)  Discharge Placement              Patient [facility] bed at: [facility] Patient to be transferred to facility by: [facility] Name of family member notified: Adrien Patient and family notified of of transfer: 07/08/20  Discharge Plan and Services   Discharge Planning Services: CM Consult                                 Social Determinants of Health (SDOH) Interventions     Readmission Risk Interventions No flowsheet data found.

## 2020-07-08 NOTE — TOC Progression Note (Signed)
Transition of Care [facility]) - Progression Note    Patient Details  Name: Jeremy Knapp MRN: [id number] Date of Birth: 07-15-1950  Transition of Care [facility]) CM/SW Contact  Kermit Balo, RN Phone Number: 07/08/2020, 8:50 AM  Clinical Narrative:    Decision made for [facility] and they have received insurance auth for an admission tomorrow. Pt and niece aware and in agreement. Covid ordered for d/c tomorrow. MD updated. TOC following.   Expected Discharge Plan: Home/Self Care Barriers to Discharge: Continued Medical Work up  Expected Discharge Plan and Services Expected Discharge Plan: Home/Self Care   Discharge Planning Services: CM Consult   Living arrangements for the past 2 months: Single Family Home Expected Discharge Date: 07/08/20                                     Social Determinants of Health (SDOH) Interventions    Readmission Risk Interventions No flowsheet data found.

## 2020-07-08 NOTE — Discharge Summary (Signed)
Physician Discharge Summary  Jeremy Knapp WJX:[id number] DOB: [birth year] DOA: 07/02/2020  PCP: System, Provider Not In  Admit date: 07/02/2020 Discharge date: 07/08/2020  Admitted From: home Disposition:  SNF  Recommendations for Outpatient Follow-up:  1. Follow up with PCP in 1-2 weeks 2. Follow-up with neurology in 1 to 2 months 3. Patient has been placed on aspirin and Plavix for 3 weeks followed by aspirin alone  Home Health: none Equipment/Devices: none  Discharge Condition: stable CODE STATUS: Full code Diet recommendation: heart healthy  HPI: Per admitting MD, Jeremy Knapp is a 70 y.o. male without known significant medical history presenting with slurred speech and R-sided weakness.  He started feeling dizzy and having trouble moving his R body.  He noticed this Thursday evening.  It wasn't that bad so he went to work Friday. He was mildly slow but could manage.  Last night, he got on the train and it got feel bad.  He was planning to go home Wednesday.  He visits here about every 3 months.  His arm is affected but mainly it is his R leg - has to drag it and forcefully lift it, pulling his body towards the R and it causes him to fall if he doesn't catch himself.  He noticed his speech is slurred and slower than usual.  No dysarthria.  Hospital Course / Discharge diagnoses: Principal Problem Acute ischemic CVA, left corona radiata and caudate body -patient with persistent proximal and distal right upper extremity weakness, right leg.  Neurology consulted and followed patient while hospitalized.  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  2D echo showed EF 50-55%, grade 1 diastolic dysfunction.  RV is normal.  Neurology recommending dual antiplatelet therapy for 3 weeks then continue with aspirin alone.  He is also on a statin.   Active  Problems Probable hyperlipidemia -patient has refused labs after admission so lipid panel could not be obtained.  Has been placed on statin.  Recheck as an outpatient. Thyroid nodule-Outpatientfollow up.  TSH normal Tobacco use -Counseled for cessation  Sepsis ruled out   Discharge Instructions   Allergies as of 07/08/2020   No Known Allergies     Medication List    TAKE these medications   aspirin 81 MG EC tablet Take 1 tablet (81 mg total) by mouth daily. Swallow whole.   atorvastatin 40 MG tablet Commonly known as: LIPITOR Take 1 tablet (40 mg total) by mouth daily.   clopidogrel 75 MG tablet Commonly known as: PLAVIX Take 1 tablet (75 mg total) by mouth daily with breakfast for 21 days. Start taking on: July 09, 2020      Consultations:  Neurology   Procedures/Studies:  CT ANGIO HEAD W OR WO CONTRAST  Result Date: 07/02/2020 CLINICAL DATA:  Stroke. Acute left corona radiata and caudate infarct on MRI. EXAM: CT ANGIOGRAPHY HEAD AND NECK TECHNIQUE: Multidetector CT imaging of the head and neck was performed using the standard protocol during bolus administration of intravenous contrast. Multiplanar CT image reconstructions and MIPs were obtained to evaluate the vascular anatomy. Carotid stenosis measurements (when applicable) are obtained utilizing NASCET criteria, using the distal internal carotid diameter as the denominator. CONTRAST:  75mL OMNIPAQUE IOHEXOL 350 MG/ML SOLN COMPARISON:  None. FINDINGS: CTA NECK FINDINGS Aortic arch: Standard 3 vessel aortic arch with widely patent arch vessel origins. Right carotid system: Patent without evidence  of stenosis, dissection, or significant atherosclerosis. Left carotid system: Patent without evidence of stenosis, dissection, or significant atherosclerosis. Vertebral arteries: The left vertebral artery is patent and strongly dominant without evidence of a significant stenosis or dissection. The right vertebral artery is  extremely hypoplastic and is intermittently poorly visualized throughout its course in the neck with continuous patency not able to be confirmed. The left V3 segment is likely occluded. Skeleton: No acute osseous abnormality or suspicious osseous lesion. Other neck: 3.1 cm left thyroid nodule. No evidence of cervical lymphadenopathy. Upper chest: Clear lung apices. Review of the MIP images confirms the above findings CTA HEAD FINDINGS Anterior circulation: The internal carotid arteries are patent from skull base to carotid termini with atherosclerotic plaque resulting in mild right proximal supraclinoid stenosis. ACAs and MCAs are patent without evidence of a proximal branch occlusion or flow limiting proximal stenosis. There is mild left M1 narrowing. No aneurysm is identified. Posterior circulation: The intracranial left vertebral artery is patent with atherosclerotic irregularity and up to mild narrowing. The right V4 segment is not clearly identified and is likely occluded. Patent left PICA, bilateral AICA, and bilateral SCA origins are visualized. The basilar artery is patent with mild irregularity but no flow limiting stenosis. Posterior communicating arteries are not identified and may be diminutive or absent. The PCAs are patent with atherosclerotic type irregularity bilaterally as well as mild-to-moderate left P2 stenosis. No aneurysm is identified. Venous sinuses: Patent. Anatomic variants: Strongly dominant left vertebral artery. Review of the MIP images confirms the above findings IMPRESSION: 1. 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 2. Patent left vertebral artery with mild V4 segment stenosis. 3. Mild right supraclinoid ICA and left M1 stenoses. 4. Widely patent cervical carotid arteries. 5. 3.1 cm left thyroid nodule. Recommend thyroid US (ref: J Am Coll Radiol. 2015 Feb;12(2): 143-50). Electronically Signed   By: Sebastian Ache M.D.   On: 07/02/2020 10:23   CT HEAD WO  CONTRAST  Result Date: 07/05/2020 CLINICAL DATA:  Follow-up examination for acute stroke, worsening deficit. EXAM: CT HEAD WITHOUT CONTRAST TECHNIQUE: Contiguous axial images were obtained from the base of the skull through the vertex without intravenous contrast. COMPARISON:  Prior MRI from 07/02/2020 FINDINGS: Brain: Continued interval evolution of recently identified infarct involving the left basal ganglia/corona radiata, stable in size and distribution as compared to recent MRI. No hemorrhagic transformation, significant regional mass effect, or other complication. No other visible acute large vessel territory infarct. No other intracranial hemorrhage. No mass lesion, mass effect, or midline shift. No hydrocephalus or extra-axial fluid collection. Vascular: No hyperdense vessel. Skull: Scalp soft tissues and calvarium within normal limits. Sinuses/Orbits: Globes and orbital soft tissues demonstrate no acute finding. Paranasal sinuses and mastoid air cells are largely clear. Other: None. IMPRESSION: 1. Continued interval evolution of recently identified infarct involving the left basal ganglia/corona radiata, stable in size and distribution as compared to recent MRI. No hemorrhagic transformation, significant regional mass effect, or other complication. 2. No other new acute intracranial abnormality. Electronically Signed   By: Rise Mu M.D.   On: 07/05/2020 22:13   CT Head Wo Contrast  Result Date: 07/02/2020 CLINICAL DATA:  Facial trauma, weakness EXAM: CT HEAD WITHOUT CONTRAST TECHNIQUE: Contiguous axial images were obtained from the base of the skull through the vertex without intravenous contrast. COMPARISON:  None. FINDINGS: Brain: No evidence of large-territorial acute infarction. No parenchymal hemorrhage. No mass lesion. No extra-axial collection. No mass effect or midline shift. No hydrocephalus. Basilar cisterns  are patent. Vascular: No hyperdense vessel. Atherosclerotic  calcifications are present within the cavernous internal carotid arteries. Skull: No acute fracture or focal lesion. Sinuses/Orbits: Paranasal sinuses and mastoid air cells are clear. The orbits are unremarkable. Other: None. IMPRESSION: No acute intracranial abnormality. Electronically Signed   By: Tish Frederickson M.D.   On: 07/02/2020 04:35   CT ANGIO NECK W OR WO CONTRAST  Result Date: 07/02/2020 CLINICAL DATA:  Stroke. Acute left corona radiata and caudate infarct on MRI. EXAM: CT ANGIOGRAPHY HEAD AND NECK TECHNIQUE: Multidetector CT imaging of the head and neck was performed using the standard protocol during bolus administration of intravenous contrast. Multiplanar CT image reconstructions and MIPs were obtained to evaluate the vascular anatomy. Carotid stenosis measurements (when applicable) are obtained utilizing NASCET criteria, using the distal internal carotid diameter as the denominator. CONTRAST:  75mL OMNIPAQUE IOHEXOL 350 MG/ML SOLN COMPARISON:  None. FINDINGS: CTA NECK FINDINGS Aortic arch: Standard 3 vessel aortic arch with widely patent arch vessel origins. Right carotid system: Patent without evidence of stenosis, dissection, or significant atherosclerosis. Left carotid system: Patent without evidence of stenosis, dissection, or significant atherosclerosis. Vertebral arteries: The left vertebral artery is patent and strongly dominant without evidence of a significant stenosis or dissection. The right vertebral artery is extremely hypoplastic and is intermittently poorly visualized throughout its course in the neck with continuous patency not able to be confirmed. The left V3 segment is likely occluded. Skeleton: No acute osseous abnormality or suspicious osseous lesion. Other neck: 3.1 cm left thyroid nodule. No evidence of cervical lymphadenopathy. Upper chest: Clear lung apices. Review of the MIP images confirms the above findings CTA HEAD FINDINGS Anterior circulation: The internal carotid  arteries are patent from skull base to carotid termini with atherosclerotic plaque resulting in mild right proximal supraclinoid stenosis. ACAs and MCAs are patent without evidence of a proximal branch occlusion or flow limiting proximal stenosis. There is mild left M1 narrowing. No aneurysm is identified. Posterior circulation: The intracranial left vertebral artery is patent with atherosclerotic irregularity and up to mild narrowing. The right V4 segment is not clearly identified and is likely occluded. Patent left PICA, bilateral AICA, and bilateral SCA origins are visualized. The basilar artery is patent with mild irregularity but no flow limiting stenosis. Posterior communicating arteries are not identified and may be diminutive or absent. The PCAs are patent with atherosclerotic type irregularity bilaterally as well as mild-to-moderate left P2 stenosis. No aneurysm is identified. Venous sinuses: Patent. Anatomic variants: Strongly dominant left vertebral artery. Review of the MIP images confirms the above findings IMPRESSION: 1. 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 2. Patent left vertebral artery with mild V4 segment stenosis. 3. Mild right supraclinoid ICA and left M1 stenoses. 4. Widely patent cervical carotid arteries. 5. 3.1 cm left thyroid nodule. Recommend thyroid US (ref: J Am Coll Radiol. 2015 Feb;12(2): 143-50). Electronically Signed   By: Sebastian Ache M.D.   On: 07/02/2020 10:23   MR BRAIN WO CONTRAST  Result Date: 07/02/2020 CLINICAL DATA:  Sudden onset weakness since yesterday. EXAM: MRI HEAD WITHOUT CONTRAST TECHNIQUE: Multiplanar, multiecho pulse sequences of the brain and surrounding structures were obtained without intravenous contrast. COMPARISON:  Head CT from earlier today FINDINGS: Brain: Wedge of restricted diffusion at the left corona radiata and caudate body. Minor underlying chronic white matter changes. No pre-existing gray matter infarct, acute hemorrhage,  hydrocephalus, mass, or collection. Focus of chronic hemorrhage or mineralization at the right occipital pole. Vascular: Normal flow voids  Skull and upper cervical spine: Normal marrow signal Sinuses/Orbits: Opacified left ethmoid sinus.  No acute finding. 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 Electronically Signed   By: Marnee Spring M.D.   On: 07/02/2020 08:21   ECHOCARDIOGRAM COMPLETE  Result Date: 07/02/2020    ECHOCARDIOGRAM REPORT   Patient Name:   Jeremy Knapp Date of Exam: 07/02/2020 Medical Rec #:  [id number]     Height:       72.0 in Accession #:    [id number]    Weight:       190.0 lb Date of Birth:  1950/12/15     BSA:          2.085 m Patient Age:    69 years      BP:           137/77 mmHg Patient Gender: M             HR:           70 bpm. Exam Location:  Inpatient Procedure: 2D Echo Indications:    TIA  History:        Patient has no prior history of Echocardiogram examinations.  Sonographer:    Delcie Roch Referring Phys: [id number] JENNIFER YATES IMPRESSIONS  1. Left ventricular ejection fraction, by estimation, is 50 to 55%. The left ventricle has low normal function. The left ventricle has no regional wall motion abnormalities. Left ventricular diastolic parameters are consistent with Grade I diastolic dysfunction (impaired relaxation). Global longitudinal strain performed but not reported based on interpreter judgement due to suboptimal tracking.  2. Right ventricular systolic function is normal. The right ventricular size is normal. There is normal pulmonary artery systolic pressure. The estimated right ventricular systolic pressure is 20.0 mmHg.  3. The mitral valve is normal in structure. Trivial mitral valve regurgitation. No evidence of mitral stenosis.  4. The aortic valve is tricuspid. Aortic valve regurgitation is not visualized. Mild aortic valve sclerosis is present, with no evidence of aortic valve stenosis.  5. The inferior vena cava is  normal in size with greater than 50% respiratory variability, suggesting right atrial pressure of 3 mmHg. FINDINGS  Left Ventricle: Left ventricular ejection fraction, by estimation, is 50 to 55%. The left ventricle has low normal function. The left ventricle has no regional wall motion abnormalities. Global longitudinal strain performed but not reported based on interpreter judgement due to suboptimal tracking. The left ventricular internal cavity size was normal in size. There is no left ventricular hypertrophy. Left ventricular diastolic parameters are consistent with Grade I diastolic dysfunction (impaired relaxation). Normal left ventricular filling pressure. Right Ventricle: The right ventricular size is normal. No increase in right ventricular wall thickness. Right ventricular systolic function is normal. There is normal pulmonary artery systolic pressure. The tricuspid regurgitant velocity is 2.06 m/s, and  with an assumed right atrial pressure of 3 mmHg, the estimated right ventricular systolic pressure is 20.0 mmHg. Left Atrium: Left atrial size was normal in size. Right Atrium: Right atrial size was normal in size. Pericardium: There is no evidence of pericardial effusion. Mitral Valve: The mitral valve is normal in structure. There is mild calcification of the mitral valve leaflet(s). Trivial mitral valve regurgitation. No evidence of mitral valve stenosis. Tricuspid Valve: The tricuspid valve is normal in structure. Tricuspid valve regurgitation is trivial. No evidence of tricuspid stenosis. Aortic Valve: The aortic valve is tricuspid. Aortic valve regurgitation is not visualized. Mild aortic valve sclerosis is present,  with no evidence of aortic valve stenosis. Pulmonic Valve: The pulmonic valve was normal in structure. Pulmonic valve regurgitation is trivial. No evidence of pulmonic stenosis. Aorta: The aortic root is normal in size and structure. Venous: The inferior vena cava is normal in size with  greater than 50% respiratory variability, suggesting right atrial pressure of 3 mmHg. IAS/Shunts: No atrial level shunt detected by color flow Doppler.  LEFT VENTRICLE PLAX 2D LVIDd:         5.50 cm  Diastology LVIDs:         3.90 cm  LV e' medial:    7.07 cm/s LV PW:         1.00 cm  LV E/e' medial:  10.3 LV IVS:        0.80 cm  LV e' lateral:   9.14 cm/s LVOT diam:     2.10 cm  LV E/e' lateral: 8.0 LV SV:         58 LV SV Index:   28 LVOT Area:     3.46 cm  RIGHT VENTRICLE             IVC RV S prime:     11.30 cm/s  IVC diam: 2.30 cm TAPSE (M-mode): 2.6 cm LEFT ATRIUM             Index       RIGHT ATRIUM           Index LA diam:        4.30 cm 2.06 cm/m  RA Area:     12.70 cm LA Vol (A2C):   60.0 ml 28.78 ml/m RA Volume:   29.60 ml  14.20 ml/m LA Vol (A4C):   51.8 ml 24.85 ml/m LA Biplane Vol: 56.5 ml 27.10 ml/m  AORTIC VALVE LVOT Vmax:   77.70 cm/s LVOT Vmean:  51.400 cm/s LVOT VTI:    0.168 m  AORTA Ao Root diam: 3.60 cm Ao Asc diam:  3.60 cm MITRAL VALVE                TRICUSPID VALVE MV Area (PHT): 3.65 cm     TR Peak grad:   17.0 mmHg MV Decel Time: 208 msec     TR Vmax:        206.00 cm/s MV E velocity: 72.80 cm/s MV A velocity: 106.00 cm/s  SHUNTS MV E/A ratio:  0.69         Systemic VTI:  0.17 m                             Systemic Diam: 2.10 cm Armanda Magic MD Electronically signed by Armanda Magic MD Signature Date/Time: 07/02/2020/11:10:29 AM    Final      Subjective: - no chest pain, shortness of breath, no abdominal pain, nausea or vomiting.   Discharge Exam: BP 138/82 (BP Location: Left Arm)   Pulse 78   Temp 98.2 F (36.8 C) (Oral)   Resp 18   Ht 6' (1.829 m)   Wt 86.2 kg   SpO2 97%   BMI 25.77 kg/m   General: Pt is alert, awake, not in acute distress Cardiovascular: RRR, S1/S2 +, no rubs, no gallops Respiratory: CTA bilaterally, no wheezing, no rhonchi Abdominal: Soft, NT, ND, bowel sounds + Extremities: no edema, no cyanosis Neuro: Right-sided weakness upper extremity  more than lower extremity.  No slurred speech, cranial nerves grossly intact.  Normal strength on left  The results of significant diagnostics from this hospitalization (including imaging, microbiology, ancillary and laboratory) are listed below for reference.     Microbiology: Recent Results (from the past 240 hour(s))  Resp Panel by RT-PCR (Flu A&B, Covid) Nasopharyngeal Swab     Status: None   Collection Time: 07/02/20  5:30 AM   Specimen: Nasopharyngeal Swab; Nasopharyngeal(NP) swabs in vial transport medium  Result Value Ref Range Status   SARS Coronavirus 2 by RT PCR NEGATIVE NEGATIVE Final    Comment: (NOTE) SARS-CoV-2 target nucleic acids are NOT DETECTED.  The SARS-CoV-2 RNA is generally detectable in upper respiratory specimens during the acute phase of infection. The lowest concentration of SARS-CoV-2 viral copies this assay can detect is 138 copies/mL. A negative result does not preclude SARS-Cov-2 infection and should not be used as the sole basis for treatment or other patient management decisions. A negative result may occur with  improper specimen collection/handling, submission of specimen other than nasopharyngeal swab, presence of viral mutation(s) within the areas targeted by this assay, and inadequate number of viral copies(<138 copies/mL). A negative result must be combined with clinical observations, patient history, and epidemiological information. The expected result is Negative.  Fact Sheet for Patients:  BloggerCourse.com  Fact Sheet for Healthcare Providers:  SeriousBroker.it  This test is no t yet approved or cleared by the [place] FDA and  has been authorized for detection and/or diagnosis of SARS-CoV-2 by FDA under an Emergency Use Authorization (EUA). This EUA will remain  in effect (meaning this test can be used) for the duration of the COVID-19 declaration under Section 564(b)(1) of the  Act, 21 U.S.C.section 360bbb-3(b)(1), unless the authorization is terminated  or revoked sooner.       Influenza A by PCR NEGATIVE NEGATIVE Final   Influenza B by PCR NEGATIVE NEGATIVE Final    Comment: (NOTE) The Xpert Xpress SARS-CoV-2/FLU/RSV plus assay is intended as an aid in the diagnosis of influenza from Nasopharyngeal swab specimens and should not be used as a sole basis for treatment. Nasal washings and aspirates are unacceptable for Xpert Xpress SARS-CoV-2/FLU/RSV testing.  Fact Sheet for Patients: BloggerCourse.com  Fact Sheet for Healthcare Providers: SeriousBroker.it  This test is not yet approved or cleared by the [place] FDA and has been authorized for detection and/or diagnosis of SARS-CoV-2 by FDA under an Emergency Use Authorization (EUA). This EUA will remain in effect (meaning this test can be used) for the duration of the COVID-19 declaration under Section 564(b)(1) of the Act, 21 U.S.C. section 360bbb-3(b)(1), unless the authorization is terminated or revoked.  Performed at [facility], [address], [place], [place] [address]   SARS CORONAVIRUS 2 (TAT 6-24 HRS) Nasopharyngeal Nasopharyngeal Swab     Status: None   Collection Time: 07/07/20 12:31 PM   Specimen: Nasopharyngeal Swab  Result Value Ref Range Status   SARS Coronavirus 2 NEGATIVE NEGATIVE Final    Comment: (NOTE) SARS-CoV-2 target nucleic acids are NOT DETECTED.  The SARS-CoV-2 RNA is generally detectable in upper and lower respiratory specimens during the acute phase of infection. Negative results do not preclude SARS-CoV-2 infection, do not rule out co-infections with other pathogens, and should not be used as the sole basis for treatment or other patient management decisions. Negative results must be combined with clinical observations, patient history, and epidemiological information. The expected result is  Negative.  Fact Sheet for Patients: HairSlick.no  Fact Sheet for Healthcare Providers: quierodirigir.com  This test is not yet approved  or cleared by the [place] FDA and  has been authorized for detection and/or diagnosis of SARS-CoV-2 by FDA under [place] Emergency Use Authorization (EUA). This EUA will remain  in effect (meaning this test can be used) for the duration of the COVID-19 declaration under Se ction 564(b)(1) of the Act, 21 U.S.C. section 360bbb-3(b)(1), unless the authorization is terminated or revoked sooner.  Performed at [facility], [address] [place], [place] [address]      Labs: Basic Metabolic Panel: Recent Labs  Lab 07/02/20 0315 07/02/20 0324  NA 137 139  K 4.2 4.1  CL 103 104  CO2 25  --   GLUCOSE 153* 153*  BUN 16 17  CREATININE 1.02 1.00  CALCIUM 9.6  --    Liver Function Tests: No results for input(s): AST, ALT, ALKPHOS, BILITOT, PROT, ALBUMIN in the last 168 hours. CBC: Recent Labs  Lab 07/02/20 0315 07/02/20 0324  WBC 4.4  --   NEUTROABS 2.4  --   HGB 15.2 14.6  HCT 45.4 43.0  MCV 94.4  --   PLT 192  --    CBG: Recent Labs  Lab 07/02/20 0305 07/02/20 0340  GLUCAP 150* 164*   Hgb A1c No results for input(s): HGBA1C in the last 72 hours. Lipid Profile No results for input(s): CHOL, HDL, LDLCALC, TRIG, CHOLHDL, LDLDIRECT in the last 72 hours. Thyroid function studies No results for input(s): TSH, T4TOTAL, T3FREE, THYROIDAB in the last 72 hours.  Invalid input(s): FREET3 Urinalysis    Component Value Date/Time   COLORURINE YELLOW 07/02/2020 0309   APPEARANCEUR CLEAR 07/02/2020 0309   LABSPEC 1.013 07/02/2020 0309   PHURINE 6.0 07/02/2020 0309   GLUCOSEU NEGATIVE 07/02/2020 0309   HGBUR NEGATIVE 07/02/2020 0309   BILIRUBINUR NEGATIVE 07/02/2020 0309   KETONESUR NEGATIVE 07/02/2020 0309   PROTEINUR NEGATIVE 07/02/2020 0309   NITRITE NEGATIVE 07/02/2020  0309   LEUKOCYTESUR TRACE (A) 07/02/2020 0309    FURTHER DISCHARGE INSTRUCTIONS:   Get Medicines reviewed and adjusted: Please take all your medications with you for your next visit with your Primary MD   Laboratory/radiological data: Please request your Primary MD to go over all hospital tests and procedure/radiological results at the follow up, please ask your Primary MD to get all Hospital records sent to his/her office.   In some cases, they will be blood work, cultures and biopsy results pending at the time of your discharge. Please request that your primary care M.D. goes through all the records of your hospital data and follows up on these results.   Also Note the following: If you experience worsening of your admission symptoms, develop shortness of breath, life threatening emergency, suicidal or homicidal thoughts you must seek medical attention immediately by calling 911 or calling your MD immediately  if symptoms less severe.   You must read complete instructions/literature along with all the possible adverse reactions/side effects for all the Medicines you take and that have been prescribed to you. Take any new Medicines after you have completely understood and accpet all the possible adverse reactions/side effects.    Do not drive when taking Pain medications or sleeping medications (Benzodaizepines)   Do not take more than prescribed Pain, Sleep and Anxiety Medications. It is not advisable to combine anxiety,sleep and pain medications without talking with your primary care practitioner   Special Instructions: If you have smoked or chewed Tobacco  in the last 2 yrs please stop smoking, stop any regular Alcohol  and  or any Recreational drug use.   Wear Seat belts while driving.   Please note: You were cared for by a hospitalist during your hospital stay. Once you are discharged, your primary care physician will handle any further medical issues. Please note that NO REFILLS for  any discharge medications will be authorized once you are discharged, as it is imperative that you return to your primary care physician (or establish a relationship with a primary care physician if you do not have one) for your post hospital discharge needs so that they can reassess your need for medications and monitor your lab values.  Time coordinating discharge: 40 minutes  SIGNED:  Pamella Pert, MD, PhD 07/08/2020, 8:08 AM

## 2020-07-08 NOTE — Progress Notes (Signed)
Pt has been discharged from the unit. All IV and tele has been disconnected. All belongings has been sent with the pt. Pt sent in good spirits.

## 2020-07-08 NOTE — Progress Notes (Signed)
Pt removed the tele leads, refused to have them back on because the stickers are "bordering his skin". Tele monitoring is currently on hold

## 2023-01-02 IMAGING — MR MR HEAD W/O CM
12 of 13 series · 44 of 48 positions shown · non-contrast
Comparison: Head CT from earlier today

CLINICAL DATA: Sudden onset weakness since yesterday.

EXAM:
MRI HEAD WITHOUT CONTRAST
TECHNIQUE: Multiplanar, multiecho pulse sequences of the brain and surrounding
structures were obtained without intravenous contrast.

[Series 5: DWI · axial · 3.0mm · 0.88mm/px · z∈[-76,+61]mm · 8 of 96 slices shown (1 of 4)]
[im 1/96]
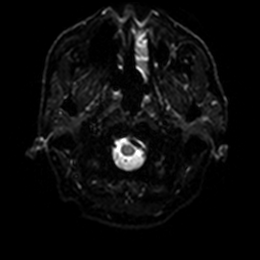
[im 14/96]
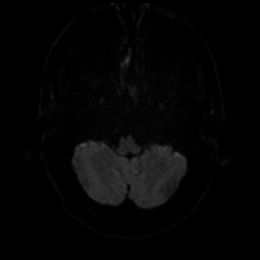
[im 28/96]
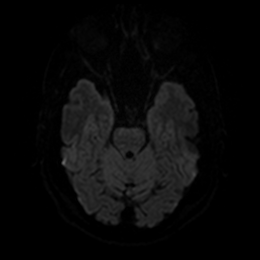
[im 41/96]
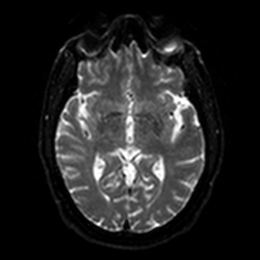
[im 55/96]
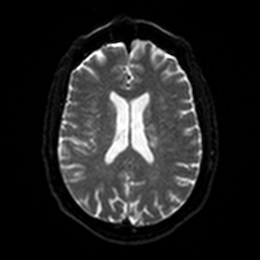
[im 68/96]
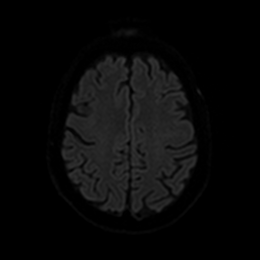
[im 82/96]
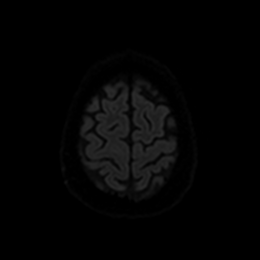
[im 96/96]
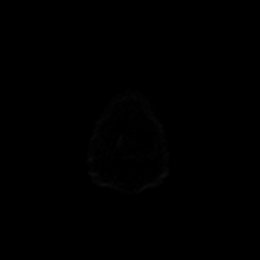

[Series 6: DWI · axial · 3.0mm · 0.88mm/px · z∈[-76,+61]mm · 4 of 48 slices shown (2 of 4)]
[im 1/48]
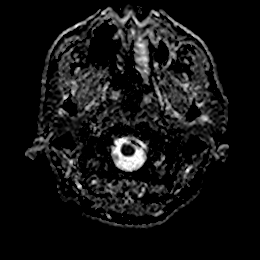
[im 16/48]
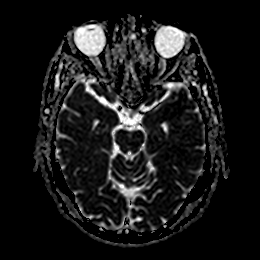
[im 32/48]
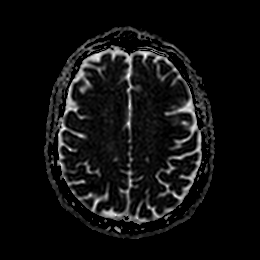
[im 48/48]
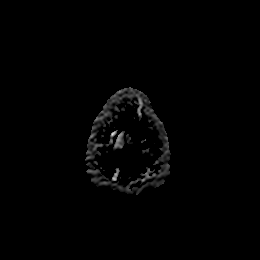

[Series 7: DWI · coronal · 4.0mm · 0.88mm/px · 5 of 72 slices shown (3 of 4)]
[im 1/72]
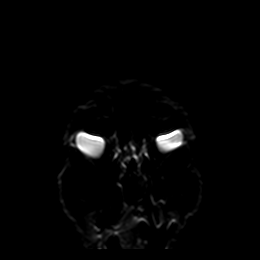
[im 18/72]
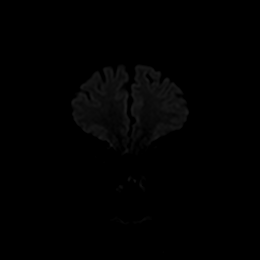
[im 36/72]
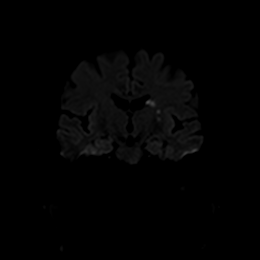
[im 54/72]
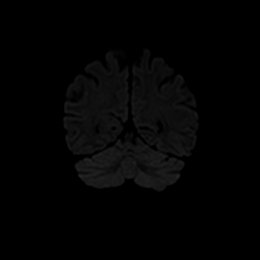
[im 72/72]
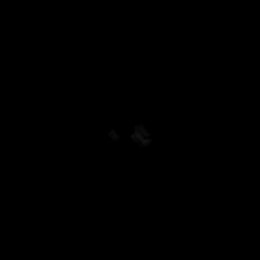

[Series 8: DWI · coronal · 4.0mm · 0.88mm/px · 3 of 36 slices shown (4 of 4)]
[im 1/36]
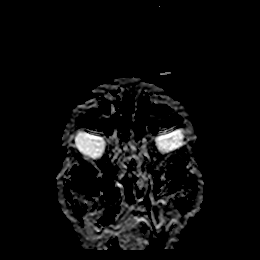
[im 18/36]
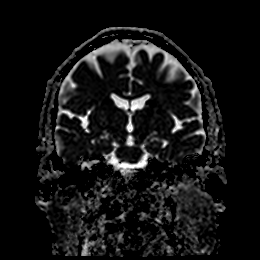
[im 36/36]
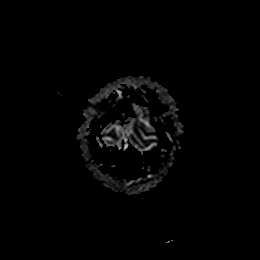

[Series 9: T1 · sagittal · 5.0mm · 0.75mm/px · 2 of 23 slices shown]
[im 1/23]
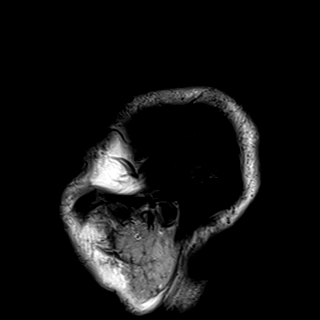
[im 23/23]
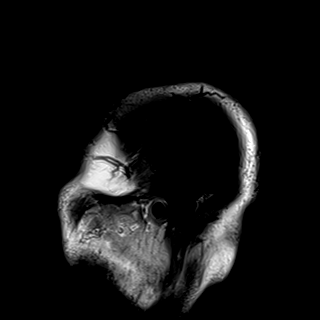

[Series 10: T2 · axial · 5.0mm · 0.72mm/px · z∈[-84,+69]mm · 2 of 27 slices shown (1 of 2)]
[im 1/27]
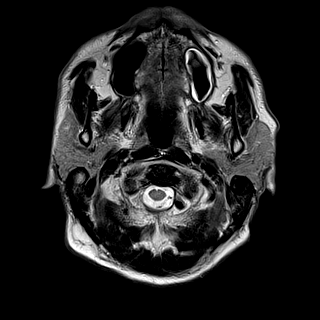
[im 27/27]
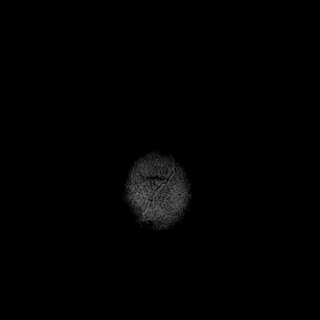

[Series 11: FLAIR · axial · 5.0mm · 0.45mm/px · z∈[-85,+68]mm · 2 of 27 slices shown]
[im 1/27]
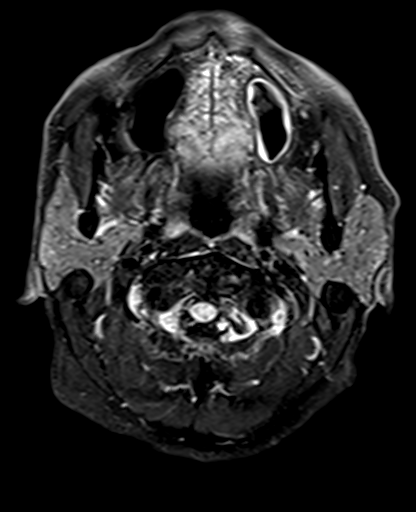
[im 27/27]
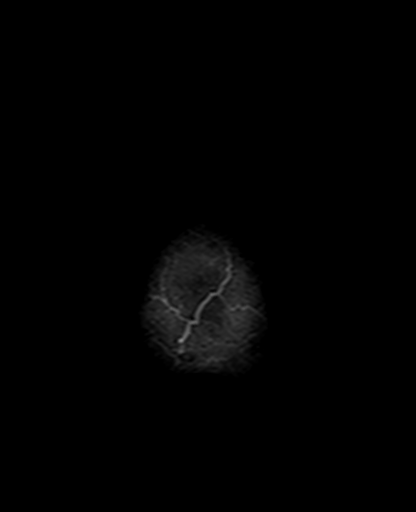

[Series 12: mag_images · axial · 3.0mm · 0.90mm/px · z∈[-96,+77]mm · 4 of 60 slices shown]
[im 1/60]
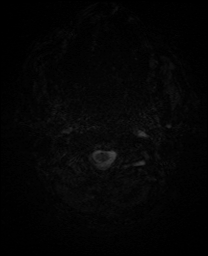
[im 20/60]
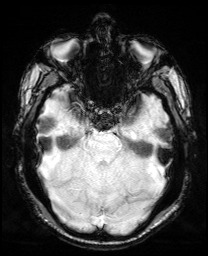
[im 40/60]
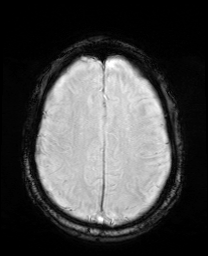
[im 60/60]
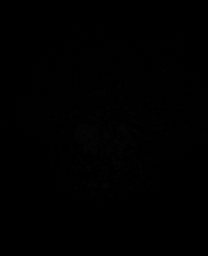

[Series 13: pha_images · axial · 3.0mm · 0.90mm/px · z∈[-93,+74]mm · 4 of 56 slices shown]
[im 1/56]
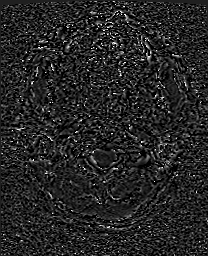
[im 19/56]
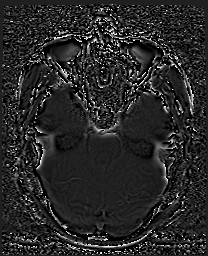
[im 37/56]
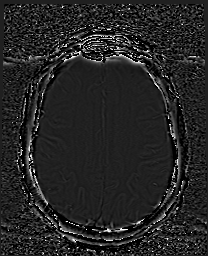
[im 56/56]
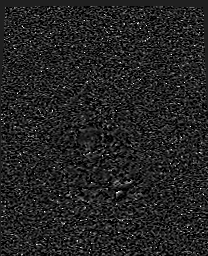

[Series 14: swi_images · axial · 3.0mm · 0.90mm/px · z∈[-96,+77]mm · 4 of 60 slices shown]
[im 1/60]
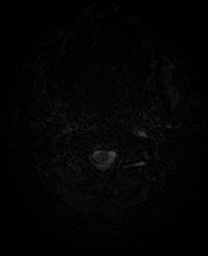
[im 20/60]
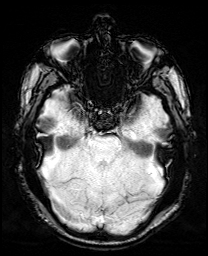
[im 40/60]
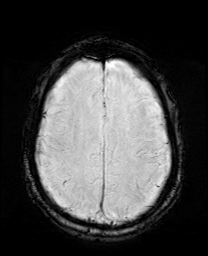
[im 60/60]
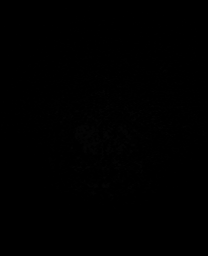

[Series 15: mip_images(sw) · axial · 24.0mm · 0.90mm/px · z∈[-86,+66]mm · 4 of 53 slices shown]
[im 1/53]
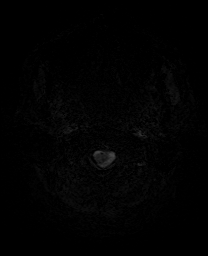
[im 18/53]
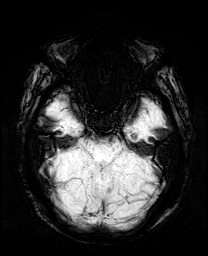
[im 35/53]
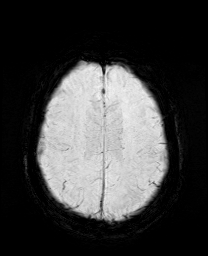
[im 53/53]
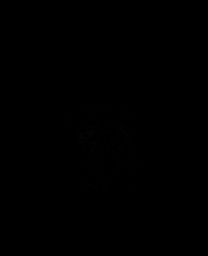

[Series 17: T2 · coronal · 5.0mm · 0.34mm/px · 2 of 29 slices shown (2 of 2)]
[im 1/29]
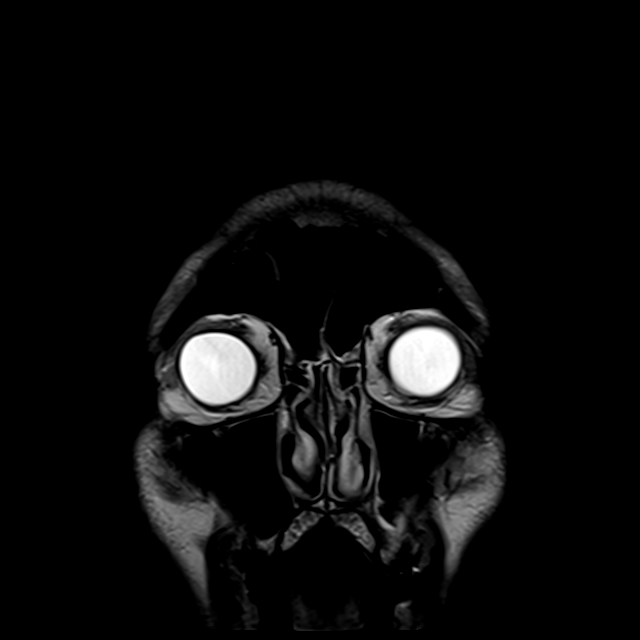
[im 29/29]
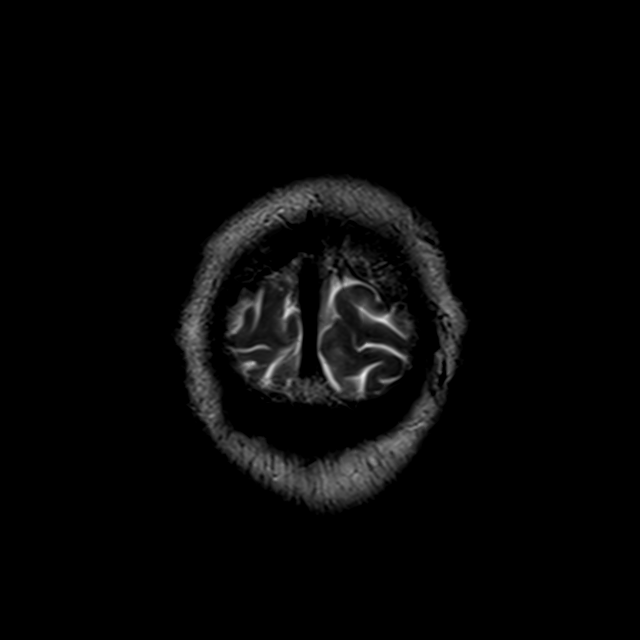

[44 of 48 positions shown; findings below may reference images not displayed]

FINDINGS: Brain: Wedge of restricted diffusion at the left corona radiata and
caudate body. Minor underlying chronic white matter changes. No
pre-existing gray matter infarct, acute hemorrhage, hydrocephalus,
mass, or collection. Focus of chronic hemorrhage or mineralization
at the right occipital pole.

Vascular: Normal flow voids

Skull and upper cervical spine: Normal marrow signal

Sinuses/Orbits: Opacified left ethmoid sinus.  No acute finding.
IMPRESSION: Acute perforator infarct at the left corona radiata and caudate
body.
